# Patient Record
Sex: Male | Born: 1950 | Race: Black or African American | Hispanic: No | Marital: Single | Smoking: Former smoker
Health system: Southern US, Community
[De-identification: ages and names within clinical notes are randomized; demographics above are authoritative.]

## PROBLEM LIST (undated history)

## (undated) DIAGNOSIS — M1990 Unspecified osteoarthritis, unspecified site: Secondary | ICD-10-CM

## (undated) DIAGNOSIS — D649 Anemia, unspecified: Secondary | ICD-10-CM

## (undated) DIAGNOSIS — K219 Gastro-esophageal reflux disease without esophagitis: Secondary | ICD-10-CM

---

## 2004-06-21 ENCOUNTER — Encounter: Admission: RE | Admit: 2004-06-21 | Discharge: 2004-06-21 | Payer: Self-pay | Admitting: Family Medicine

## 2005-02-17 ENCOUNTER — Encounter: Admission: RE | Admit: 2005-02-17 | Discharge: 2005-02-17 | Payer: Self-pay | Admitting: Family Medicine

## 2009-04-26 ENCOUNTER — Emergency Department (HOSPITAL_COMMUNITY): Admission: EM | Admit: 2009-04-26 | Discharge: 2009-04-26 | Payer: Self-pay | Admitting: Emergency Medicine

## 2015-09-29 ENCOUNTER — Encounter (HOSPITAL_COMMUNITY): Payer: Self-pay | Admitting: Emergency Medicine

## 2015-09-29 ENCOUNTER — Ambulatory Visit (HOSPITAL_COMMUNITY)
Admission: EM | Admit: 2015-09-29 | Discharge: 2015-09-29 | Disposition: A | Discharge status: Home / Self Care | Payer: BC Managed Care – PPO | Attending: Emergency Medicine | Admitting: Emergency Medicine

## 2015-09-29 DIAGNOSIS — J069 Acute upper respiratory infection, unspecified: Secondary | ICD-10-CM | POA: Diagnosis not present

## 2015-09-29 DIAGNOSIS — M7542 Impingement syndrome of left shoulder: Secondary | ICD-10-CM

## 2015-09-29 DIAGNOSIS — M25812 Other specified joint disorders, left shoulder: Secondary | ICD-10-CM

## 2015-09-29 DIAGNOSIS — M791 Myalgia: Secondary | ICD-10-CM | POA: Diagnosis not present

## 2015-09-29 DIAGNOSIS — M7918 Myalgia, other site: Secondary | ICD-10-CM

## 2015-09-29 MED ORDER — IPRATROPIUM BROMIDE 0.06 % NA SOLN: 2.0000 | Freq: Four times a day (QID) | NASAL | Status: DC

## 2015-09-29 MED ORDER — CYCLOBENZAPRINE HCL 10 MG PO TABS: 10.0000 mg | ORAL_TABLET | Freq: Three times a day (TID) | ORAL | Status: DC | PRN

## 2015-09-29 MED ORDER — ALBUTEROL SULFATE HFA 108 (90 BASE) MCG/ACT IN AERS: 1.0000 | INHALATION_SPRAY | Freq: Four times a day (QID) | RESPIRATORY_TRACT | Status: DC | PRN

## 2015-09-29 MED ORDER — AEROCHAMBER PLUS MISC: Status: DC

## 2015-09-29 MED ORDER — AZITHROMYCIN 250 MG PO TABS: 250.0000 mg | ORAL_TABLET | Freq: Every day | ORAL | Status: DC

## 2015-09-29 MED ORDER — GUAIFENESIN-CODEINE 100-10 MG/5ML PO SYRP: 10.0000 mL | ORAL_SOLUTION | Freq: Four times a day (QID) | ORAL | Status: DC | PRN

## 2015-09-29 NOTE — Discharge Instructions (Signed)
Start some Allegra D, Claritin-D, Zyrtec-D. Start some saline nasal irrigation. Use the nasal spray, albuterol and spacer in the Cheratussin for a few days and if you get better do not fill the azithromycin. If not getting better, then go ahead and fill it.  2 tabs of Aleve twice a day. Flexeril as needed for muscle spasm. Follow-up with orthopedics if the shoulder is not getting better in one week.  Follow up with a primary care physician of your choice. Here a list of primary care practices.  If you need help with getting financial assistance, assistance in getting medications or finding a primary care physican, contact Maggy Mena here at the Urgent Care [facility]. Her phone number is [phone].   [facility] [phone] [address] [place] [address]  [facility]: [address] [place] [id number]  [phone]  [facility]: [address] [place] [id number]   [phone]  [facility]: [address] [place] [address]  [phone]  [facility] : [address] [place] [id number] [phone]  [facility]: [address] [place] [phone]  [facility]: [address] [place] [id number] [phone]  Dr. Oneal GroutMahima Pandey [address] [facility] [place] [id number]  [phone]  Dr. Jackie PlumGeorge Osei-Bonsu, [facility]. [address] [place] [address]  [phone]

## 2015-09-29 NOTE — ED Notes (Signed)
C/o cold sx onset x2 weeks associated w/prod cough, chills, runny nose and congestion.... A&O x4... No acute distress.

## 2015-09-29 NOTE — ED Provider Notes (Signed)
HPI  SUBJECTIVE:  Aaron Mejia is a 65 y.o. male who presents with 2 complaints. First he reports URI-like symptoms with nasal congestion, rhinorrhea, postnasal drip, watery eyes, malaise, coughing, wheezing, chills for 2 weeks. States cough is same color as is postnasal drip. He states that he has been taking Aleve, Advil Cold and Sinus, NyQuil with some improvement in symptoms. There are no aggravating factors. States he cannot sleep at night secondary to the cough. Denies ear pain, sinus pain or pressure, sneezing, itchy eyes, chest pain, shortness of breath. Reports decreased appetite, but is tolerating by mouth. Denies fevers. No double sickening, sore throat, hemoptysis. He has taken antipyretic in the past 8 hours. Has a past medical history of allergies, which bother him at this time of year, and asthma. No history of hypertension, diabetes, CHF, MI, kidney disease, smoking.  Second, he reports constant stabbing posterior left shoulder pain along the shoulder blade for the past 3 weeks. States that it radiates down his left arm and reports some paresthesias described as pins and needles going down to his middle finger. States that he is right-handed, but uses left arm a lot while sweeping. Symptoms are worse with rowing motion, using his arm, better with Aleve. 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 He denies rash, neck pain, back pain, chest pain, shortness of breath, arm weakness, color changes. He has a past medical history of left shoulder injury.    History reviewed. No pertinent past medical history.  History reviewed. No pertinent past surgical history.  No family history on file.  Social History  Substance Use Topics  . Smoking status: Never Smoker   . Smokeless tobacco: None  . Alcohol Use: Yes    No current facility-administered medications for this encounter.  Current outpatient prescriptions:  .  albuterol (PROVENTIL  HFA;VENTOLIN HFA) 108 (90 Base) MCG/ACT inhaler, Inhale 1-2 puffs into the lungs every 6 (six) hours as needed for wheezing or shortness of breath., Disp: 1 Inhaler, Rfl: 0 .  azithromycin (ZITHROMAX) 250 MG tablet, Take 1 tablet (250 mg total) by mouth daily. 2 tabs po on day 1, 1 tab po on days 2-5, Disp: 6 tablet, Rfl: 0 .  cyclobenzaprine (FLEXERIL) 10 MG tablet, Take 1 tablet (10 mg total) by mouth 3 (three) times daily as needed for muscle spasms., Disp: 20 tablet, Rfl: 0 .  guaiFENesin-codeine (CHERATUSSIN AC) 100-10 MG/5ML syrup, Take 10 mLs by mouth 4 (four) times daily as needed for cough., Disp: 120 mL, Rfl: 0 .  ipratropium (ATROVENT) 0.06 % nasal spray, Place 2 sprays into both nostrils 4 (four) times daily. 3-4 times/ day, Disp: 15 mL, Rfl: 0 .  Spacer/Aero-Holding Chambers (AEROCHAMBER PLUS) inhaler, Use as instructed, Disp: 1 each, Rfl: 2  No Known Allergies   ROS  As noted in HPI.   Physical Exam  BP 139/84 mmHg  Pulse 91  Temp(Src) 99.7 F (37.6 C) (Oral)  SpO2 100%  Constitutional: Well developed, well nourished, no acute distress Eyes: PERRL, EOMI, conjunctiva normal bilaterally HENT: Normocephalic, atraumatic,mucus membranes moist Respiratory: Clear to auscultation bilaterally, no rales, no wheezing, no rhonchi Cardiovascular: Normal rate and rhythm, no murmurs, no gallops, no rubs GI: Soft, nondistended, normal bowel sounds, nontender, no rebound, no guarding Back: no CVAT skin: No rash, skin intact Musculoskeletal: No C-spine, T-spine, rib tenderness. No rash. Positive tenderness over the rhomboid and inferior to the scapula. Pain aggravated with rowing motion. Positive empty  can test. No bony tenderness, deformity. No shoulder joint, AC joint, clavicular tenderness. No tenderness along the trapezius. Negative liftoff test,  No edema, no tenderness, no deformities. Sensation and strength intact in the median/radial/ulnar distribution. RP 2+ and equal bilaterally.  Grip 5/5 and equal bilaterally. Neurologic: Alert & oriented x 3, CN II-XII grossly intact, no motor deficits, sensation grossly intact Psychiatric: Speech and behavior appropriate   ED Course   Medications - No data to display  No orders of the defined types were placed in this encounter.   No results found for this or any previous visit (from the past 24 hour(s)). No results found.  ED Clinical Impression  URI (upper respiratory infection)  Rhomboid muscle pain  Shoulder impingement, left   ED Assessment/Plan  Presentation consistent with URI, although he could have allergy component to this. We will have him start nonsedating antihistamine/decongestant such as Claritin, Allegra, Zyrtec D, saline nasal irrigation, albuterol with spacer, Cheratussin, wait-and-see prescription of azithromycin to cover pneumonia if he does not get better with this treatment, as he has been symptomatic for 2 weeks.  Shoulder: Suggestive of overuse/muscle spasm perhaps with some rotator cuff impingement. No evidence of dislocation, fracture. Doubt cardiac cause given muscular tenderness and pain aggravated with movement. He has no neurologic findings, bony tenderness or neck, thoracic spine pain. Deferring imaging at this time. Advised 2 tabs of Aleve twice a day, also, Flexeril. States he does not need a prescription for Aleve.  Providing primary care referral for ongoing management, also providing orthopedic follow-up referral if his shoulder does not get any better.  Discussed MDM, plan and followup with patient. Discussed sn/sx that should prompt return to the ED. Patient  agrees with plan.   *This clinic note was created using Dragon dictation software. Therefore, there may be occasional mistakes despite careful proofreading.  ?  Domenick GongAshley Kaleiyah Polsky, MD 10/01/15 1046

## 2016-06-13 ENCOUNTER — Other Ambulatory Visit: Payer: Self-pay | Admitting: Family Medicine

## 2016-06-13 ENCOUNTER — Ambulatory Visit
Admission: RE | Admit: 2016-06-13 | Discharge: 2016-06-13 | Disposition: A | Discharge status: Home / Self Care | Payer: BC Managed Care – PPO | Source: Ambulatory Visit | Attending: Family Medicine | Admitting: Family Medicine

## 2016-06-13 DIAGNOSIS — R52 Pain, unspecified: Secondary | ICD-10-CM

## 2016-08-24 ENCOUNTER — Ambulatory Visit: Payer: Self-pay | Admitting: Physician Assistant

## 2016-08-24 NOTE — H&P (Signed)
TOTAL HIP ADMISSION H&P  Patient is admitted for left total hip arthroplasty.  Subjective:  Chief Complaint: left hip pain  HPI: Aaron Mejia, 66 y.o. male, has a history of pain and functional disability in the left hip(s) due to arthritis and patient has failed non-surgical conservative treatments for greater than 12 weeks to include NSAID's and/or analgesics and activity modification.  Onset of symptoms was gradual starting 5 years ago with gradually worsening course since that time.The patient noted no past surgery on the left hip(s).  Patient currently rates pain in the left hip at 8 out of 10 with activity. Patient has night pain, worsening of pain with activity and weight bearing and pain that interfers with activities of daily living. Patient has evidence of periarticular osteophytes and joint space narrowing by imaging studies. This condition presents safety issues increasing the risk of falls.There is no current active infection.  There are no active problems to display for this patient.  No past medical history on file.  No past surgical history on file.   (Not in a hospital admission) Allergies  Allergen Reactions  . Penicillins Swelling    Has never taken it was just told it makes him swell  Has patient had a PCN reaction causing immediate rash, facial/tongue/throat swelling, SOB or lightheadedness with hypotension: unknown Has patient had a PCN reaction causing severe rash involving mucus membranes or skin necrosis: unknown Has patient had a PCN reaction that required hospitalization unknown Has patient had a PCN reaction occurring within the last 10 years: No If all of the above answers are "NO", then may proceed with Cephalosporin    Social History  Substance Use Topics  . Smoking status: Never Smoker  . Smokeless tobacco: Not on file  . Alcohol use Yes    No family history on file.   Review of Systems  Musculoskeletal: Positive for joint pain.  All other  systems reviewed and are negative.   Objective:  Physical Exam  Constitutional: He is oriented to person, place, and time. He appears well-developed and well-nourished. No distress.  HENT:  Head: Normocephalic and atraumatic.  Nose: Nose normal.  Eyes: Conjunctivae and EOM are normal. Pupils are equal, round, and reactive to light.  Neck: Normal range of motion. Neck supple.  Cardiovascular: Normal rate, regular rhythm, normal heart sounds and intact distal pulses.   Respiratory: Effort normal and breath sounds normal. No respiratory distress. He has no wheezes.  GI: Soft. Bowel sounds are normal. He exhibits no distension. There is no tenderness.  Musculoskeletal:       Left hip: He exhibits decreased range of motion, tenderness and bony tenderness.  Neurological: He is alert and oriented to person, place, and time. No cranial nerve deficit.  Skin: Skin is warm and dry. No rash noted. No erythema.  Psychiatric: He has a normal mood and affect. His behavior is normal.    Vital signs in last 24 hours: @VSRANGES @  Labs:   There is no height or weight on file to calculate BMI.   Imaging Review Plain radiographs demonstrate moderate degenerative joint disease of the left hip(s). The bone quality appears to be good for age and reported activity level.  Assessment/Plan:  End stage arthritis, left hip(s)  The patient history, physical examination, clinical judgement of the provider and imaging studies are consistent with end stage degenerative joint disease of the left hip(s) and total hip arthroplasty is deemed medically necessary. The treatment options including medical management, injection therapy, arthroscopy  and arthroplasty were discussed at length. The risks and benefits of total hip arthroplasty were presented and reviewed. The risks due to aseptic loosening, infection, stiffness, dislocation/subluxation,  thromboembolic complications and other imponderables were discussed.   The patient acknowledged the explanation, agreed to proceed with the plan and consent was signed. Patient is being admitted for inpatient treatment for surgery, pain control, PT, OT, prophylactic antibiotics, VTE prophylaxis, progressive ambulation and ADL's and discharge planning.The patient is planning to be discharged home with home health services

## 2016-08-25 ENCOUNTER — Encounter (HOSPITAL_COMMUNITY): Payer: Self-pay

## 2016-08-25 ENCOUNTER — Ambulatory Visit (HOSPITAL_COMMUNITY)
Admission: RE | Admit: 2016-08-25 | Discharge: 2016-08-25 | Disposition: A | Discharge status: Home / Self Care | Payer: BC Managed Care – PPO | Source: Ambulatory Visit | Attending: Physician Assistant | Admitting: Physician Assistant

## 2016-08-25 ENCOUNTER — Encounter (HOSPITAL_COMMUNITY)
Admission: RE | Admit: 2016-08-25 | Discharge: 2016-08-25 | Disposition: A | Discharge status: Home / Self Care | Payer: BC Managed Care – PPO | Source: Ambulatory Visit | Attending: Orthopedic Surgery | Admitting: Orthopedic Surgery

## 2016-08-25 DIAGNOSIS — M1612 Unilateral primary osteoarthritis, left hip: Secondary | ICD-10-CM | POA: Insufficient documentation

## 2016-08-25 HISTORY — DX: Gastro-esophageal reflux disease without esophagitis: K21.9

## 2016-08-25 HISTORY — DX: Unspecified osteoarthritis, unspecified site: M19.90

## 2016-08-25 HISTORY — DX: Anemia, unspecified: D64.9

## 2016-08-25 LAB — COMPREHENSIVE METABOLIC PANEL
ALK PHOS: 53 U/L (ref 38–126)
ALT: 16 U/L — ABNORMAL LOW (ref 17–63)
AST: 24 U/L (ref 15–41)
Albumin: 4.1 g/dL (ref 3.5–5.0)
Anion gap: 7 (ref 5–15)
BILIRUBIN TOTAL: 0.5 mg/dL (ref 0.3–1.2)
BUN: 18 mg/dL (ref 6–20)
CALCIUM: 8.9 mg/dL (ref 8.9–10.3)
CO2: 25 mmol/L (ref 22–32)
Chloride: 105 mmol/L (ref 101–111)
Creatinine, Ser: 1.26 mg/dL — ABNORMAL HIGH (ref 0.61–1.24)
GFR calc Af Amer: >60 mL/min (ref >60)
GFR, EST NON AFRICAN AMERICAN: 59 mL/min — AB (ref >60)
Glucose, Bld: 118 mg/dL — ABNORMAL HIGH (ref 65–99)
POTASSIUM: 4.3 mmol/L (ref 3.5–5.1)
Sodium: 137 mmol/L (ref 135–145)
TOTAL PROTEIN: 6.7 g/dL (ref 6.5–8.1)

## 2016-08-25 LAB — URINALYSIS, ROUTINE W REFLEX MICROSCOPIC
BILIRUBIN URINE: NEGATIVE
Glucose, UA: NEGATIVE mg/dL
Hgb urine dipstick: NEGATIVE
Ketones, ur: NEGATIVE mg/dL
LEUKOCYTES UA: NEGATIVE
NITRITE: NEGATIVE
PH: 5 (ref 5.0–8.0)
Protein, ur: NEGATIVE mg/dL
SPECIFIC GRAVITY, URINE: 1.015 (ref 1.005–1.030)

## 2016-08-25 LAB — CBC WITH DIFFERENTIAL/PLATELET
BASOS ABS: 0 10*3/uL (ref 0.0–0.1)
Basophils Relative: 0 %
Eosinophils Absolute: 0 10*3/uL (ref 0.0–0.7)
Eosinophils Relative: 1 %
HCT: 38.3 % — ABNORMAL LOW (ref 39.0–52.0)
Hemoglobin: 12.6 g/dL — ABNORMAL LOW (ref 13.0–17.0)
Lymphocytes Relative: 38 %
Lymphs Abs: 1.5 10*3/uL (ref 0.7–4.0)
MCH: 27.8 pg (ref 26.0–34.0)
MCHC: 32.9 g/dL (ref 30.0–36.0)
MCV: 84.4 fL (ref 78.0–100.0)
MONO ABS: 0.3 10*3/uL (ref 0.1–1.0)
MONOS PCT: 7 %
Neutro Abs: 2 10*3/uL (ref 1.7–7.7)
Neutrophils Relative %: 54 %
Platelets: 247 10*3/uL (ref 150–400)
RBC: 4.54 MIL/uL (ref 4.22–5.81)
RDW: 14.5 % (ref 11.5–15.5)
WBC: 3.8 10*3/uL — ABNORMAL LOW (ref 4.0–10.5)

## 2016-08-25 LAB — TYPE AND SCREEN
ABO/RH(D): O NEG
ANTIBODY SCREEN: NEGATIVE

## 2016-08-25 LAB — APTT: aPTT: 37 seconds — ABNORMAL HIGH (ref 24–36)

## 2016-08-25 LAB — ABO/RH: ABO/RH(D): O NEG

## 2016-08-25 LAB — PROTIME-INR
INR: 0.96
PROTHROMBIN TIME: 12.8 s (ref 11.4–15.2)

## 2016-08-25 LAB — SURGICAL PCR SCREEN
MRSA, PCR: NEGATIVE
Staphylococcus aureus: NEGATIVE

## 2016-08-25 NOTE — Pre-Procedure Instructions (Addendum)
Levora AngelWilbert B Slotnick  08/25/2016      [facility] [id number] [place] Otto- [place] - [address] [facility] [address] [place] [phone] Phone: [phone] Fax: [phone]    Your procedure is scheduled on Fri. May 25  [facility].M.  Call this number if you have problems the morning of surgery:  [phone]   Remember:  Do not eat food or drink liquids after midnight on Thurs. May 24   Take these medicines the morning of surgery with A SIP OF WATER : albuterol inhaler if needed-bring to hospital, atrovent nasal spray if needed, eye drops  1 week prior to surgery stop: etodolac (lodine), aspirin, advil, motrin, ibuprofen, aleve, BC Powders, Goody's, vitamins/herbal medicines.   Do not wear jewelry.  Do not wear lotions, powders, or perfumes, or deoderant.  Do not shave 48 hours prior to surgery.  Men may shave face and neck.  Do not bring valuables to the hospital.  [facility] is not responsible for any belongings or valuables.  Contacts, dentures or bridgework may not be worn into surgery.  Leave your suitcase in the car.  After surgery it may be brought to your room.  For patients admitted to the hospital, discharge time will be determined by your treatment team.  Patients discharged the day of surgery will not be allowed to drive home.    Special instructions:  [facility]- Preparing For Surgery  Before surgery, you can play an important role. Because skin is not sterile, your skin needs to be as free of germs as possible. You can reduce the number of germs on your skin by washing with CHG (chlorahexidine gluconate) Soap before surgery.  CHG is an antiseptic cleaner which kills germs and bonds with the skin to continue killing germs even after washing.  Please do not use if you have an allergy to CHG or antibacterial soaps. If your skin becomes reddened/irritated stop using the  CHG.  Do not shave (including legs and underarms) for at least 48 hours prior to first CHG shower. It is OK to shave your face.  Please follow these instructions carefully.   1. Shower the NIGHT BEFORE SURGERY and the MORNING OF SURGERY with CHG.   2. If you chose to wash your hair, wash your hair first as usual with your normal shampoo.  3. After you shampoo, rinse your hair and body thoroughly to remove the shampoo.  4. Use CHG as you would any other liquid soap. You can apply CHG directly to the skin and wash gently with a scrungie or a clean washcloth.   5. Apply the CHG Soap to your body ONLY FROM THE NECK DOWN.  Do not use on open wounds or open sores. Avoid contact with your eyes, ears, mouth and genitals (private parts). Wash genitals (private parts) with your normal soap.  6. Wash thoroughly, paying special attention to the area where your surgery will be performed.  7. Thoroughly rinse your body with warm water from the neck down.  8. DO NOT shower/wash with your normal soap after using and rinsing off the CHG Soap.  9. Pat yourself dry with a CLEAN TOWEL.   10. Wear CLEAN PAJAMAS   11. Place CLEAN SHEETS on your bed the night of your first shower and DO NOT SLEEP WITH PETS.    Day of Surgery: Do not apply  any deodorants/lotions. Please wear clean clothes to the hospital/surgery center.      Please read over the following fact sheets that you were given. Coughing and Deep Breathing, Total Joint Packet, MRSA Information and Surgical Site Infection Prevention

## 2016-08-26 LAB — URINE CULTURE: Culture: NO GROWTH

## 2016-08-29 LAB — HM COLONOSCOPY

## 2016-09-04 MED ORDER — TRANEXAMIC ACID 1000 MG/10ML IV SOLN
1000.0000 mg | INTRAVENOUS | Status: AC
  Administered 2016-09-05: 1000 mg via INTRAVENOUS
  Filled 2016-09-04: qty 10

## 2016-09-05 ENCOUNTER — Inpatient Hospital Stay (HOSPITAL_COMMUNITY): Payer: BC Managed Care – PPO | Admitting: Certified Registered Nurse Anesthetist

## 2016-09-05 ENCOUNTER — Encounter (HOSPITAL_COMMUNITY)
Admission: RE | Disposition: A | Discharge status: Home Health | Payer: Self-pay | Source: Ambulatory Visit | Attending: Orthopedic Surgery

## 2016-09-05 ENCOUNTER — Inpatient Hospital Stay (HOSPITAL_COMMUNITY)
Admission: RE | Admit: 2016-09-05 | Discharge: 2016-09-06 | DRG: 470 | Disposition: A | Discharge status: Home Health | Payer: BC Managed Care – PPO | Source: Ambulatory Visit | Attending: Orthopedic Surgery | Admitting: Orthopedic Surgery

## 2016-09-05 ENCOUNTER — Encounter (HOSPITAL_COMMUNITY): Payer: Self-pay | Admitting: *Deleted

## 2016-09-05 ENCOUNTER — Inpatient Hospital Stay (HOSPITAL_COMMUNITY): Payer: BC Managed Care – PPO

## 2016-09-05 DIAGNOSIS — M1612 Unilateral primary osteoarthritis, left hip: Secondary | ICD-10-CM | POA: Diagnosis present

## 2016-09-05 DIAGNOSIS — Z79899 Other long term (current) drug therapy: Secondary | ICD-10-CM | POA: Diagnosis not present

## 2016-09-05 DIAGNOSIS — M1990 Unspecified osteoarthritis, unspecified site: Secondary | ICD-10-CM | POA: Diagnosis present

## 2016-09-05 DIAGNOSIS — Z880 Allergy status to penicillin: Secondary | ICD-10-CM | POA: Diagnosis not present

## 2016-09-05 DIAGNOSIS — F17200 Nicotine dependence, unspecified, uncomplicated: Secondary | ICD-10-CM | POA: Diagnosis present

## 2016-09-05 HISTORY — PX: TOTAL HIP ARTHROPLASTY: SHX124

## 2016-09-05 SURGERY — ARTHROPLASTY, HIP, TOTAL,POSTERIOR APPROACH
Anesthesia: Spinal | Site: Hip | Laterality: Left

## 2016-09-05 MED ORDER — SUCCINYLCHOLINE CHLORIDE 200 MG/10ML IV SOSY
PREFILLED_SYRINGE | INTRAVENOUS | Status: AC
  Filled 2016-09-05: qty 10

## 2016-09-05 MED ORDER — MENTHOL 3 MG MT LOZG: 1.0000 | LOZENGE | OROMUCOSAL | Status: DC | PRN

## 2016-09-05 MED ORDER — METOCLOPRAMIDE HCL 5 MG/ML IJ SOLN: 10.0000 mg | Freq: Once | INTRAMUSCULAR | Status: DC | PRN

## 2016-09-05 MED ORDER — PHENOL 1.4 % MT LIQD: 1.0000 | OROMUCOSAL | Status: DC | PRN

## 2016-09-05 MED ORDER — TRANEXAMIC ACID 1000 MG/10ML IV SOLN
2000.0000 mg | INTRAVENOUS | Status: AC
  Administered 2016-09-05: 2000 mg via TOPICAL
  Filled 2016-09-05: qty 20

## 2016-09-05 MED ORDER — ONDANSETRON HCL 4 MG PO TABS: 4.0000 mg | ORAL_TABLET | Freq: Four times a day (QID) | ORAL | Status: DC | PRN

## 2016-09-05 MED ORDER — OXYCODONE-ACETAMINOPHEN 5-325 MG PO TABS: 1.0000 | ORAL_TABLET | ORAL | 0 refills | Status: DC | PRN

## 2016-09-05 MED ORDER — VANCOMYCIN HCL IN DEXTROSE 1-5 GM/200ML-% IV SOLN
1000.0000 mg | Freq: Two times a day (BID) | INTRAVENOUS | Status: AC
  Administered 2016-09-05: 1000 mg via INTRAVENOUS
  Filled 2016-09-05: qty 200

## 2016-09-05 MED ORDER — CHLORHEXIDINE GLUCONATE 4 % EX LIQD: 60.0000 mL | Freq: Once | CUTANEOUS | Status: DC

## 2016-09-05 MED ORDER — ACETAMINOPHEN 650 MG RE SUPP: 650.0000 mg | Freq: Four times a day (QID) | RECTAL | Status: DC | PRN

## 2016-09-05 MED ORDER — DOCUSATE SODIUM 100 MG PO CAPS
100.0000 mg | ORAL_CAPSULE | Freq: Two times a day (BID) | ORAL | Status: DC
  Administered 2016-09-05 – 2016-09-06 (×2): 100 mg via ORAL
  Filled 2016-09-05 (×2): qty 1

## 2016-09-05 MED ORDER — DEXTROSE 5 % IV SOLN
INTRAVENOUS | Status: DC | PRN
  Administered 2016-09-05: 15 ug/min via INTRAVENOUS

## 2016-09-05 MED ORDER — APIXABAN 2.5 MG PO TABS: 2.5000 mg | ORAL_TABLET | Freq: Two times a day (BID) | ORAL | 0 refills | Status: DC

## 2016-09-05 MED ORDER — FENTANYL CITRATE (PF) 100 MCG/2ML IJ SOLN
25.0000 ug | INTRAMUSCULAR | Status: DC | PRN
Start: 2016-09-05 — End: 2016-09-05

## 2016-09-05 MED ORDER — LIDOCAINE 2% (20 MG/ML) 5 ML SYRINGE
INTRAMUSCULAR | Status: AC
  Filled 2016-09-05: qty 5

## 2016-09-05 MED ORDER — LACTATED RINGERS IV SOLN: INTRAVENOUS | Status: DC

## 2016-09-05 MED ORDER — BUPIVACAINE HCL (PF) 0.5 % IJ SOLN
INTRAMUSCULAR | Status: AC
  Filled 2016-09-05: qty 30

## 2016-09-05 MED ORDER — PROPOFOL 500 MG/50ML IV EMUL
INTRAVENOUS | Status: DC | PRN
  Administered 2016-09-05: 25 ug/kg/min via INTRAVENOUS

## 2016-09-05 MED ORDER — EPHEDRINE 5 MG/ML INJ
INTRAVENOUS | Status: AC
  Filled 2016-09-05: qty 10

## 2016-09-05 MED ORDER — OXYCODONE HCL 5 MG PO TABS
5.0000 mg | ORAL_TABLET | ORAL | Status: DC | PRN
  Administered 2016-09-05 – 2016-09-06 (×5): 10 mg via ORAL
  Filled 2016-09-05 (×5): qty 2

## 2016-09-05 MED ORDER — DIPHENHYDRAMINE HCL 12.5 MG/5ML PO ELIX: 12.5000 mg | ORAL_SOLUTION | ORAL | Status: DC | PRN

## 2016-09-05 MED ORDER — METOCLOPRAMIDE HCL 5 MG/ML IJ SOLN: 5.0000 mg | Freq: Three times a day (TID) | INTRAMUSCULAR | Status: DC | PRN

## 2016-09-05 MED ORDER — VANCOMYCIN HCL IN DEXTROSE 1-5 GM/200ML-% IV SOLN
1000.0000 mg | INTRAVENOUS | Status: AC
  Administered 2016-09-05: 1000 mg via INTRAVENOUS

## 2016-09-05 MED ORDER — METHOCARBAMOL 500 MG PO TABS
500.0000 mg | ORAL_TABLET | Freq: Four times a day (QID) | ORAL | Status: DC | PRN
  Administered 2016-09-05 (×2): 500 mg via ORAL
  Filled 2016-09-05 (×2): qty 1

## 2016-09-05 MED ORDER — BUPIVACAINE HCL (PF) 0.5 % IJ SOLN
INTRAMUSCULAR | Status: DC | PRN
  Administered 2016-09-05: 3 mL via INTRATHECAL

## 2016-09-05 MED ORDER — MEPERIDINE HCL 25 MG/ML IJ SOLN: 6.2500 mg | INTRAMUSCULAR | Status: DC | PRN

## 2016-09-05 MED ORDER — PROPOFOL 10 MG/ML IV BOLUS
INTRAVENOUS | Status: AC
  Filled 2016-09-05: qty 20

## 2016-09-05 MED ORDER — MIDAZOLAM HCL 2 MG/2ML IJ SOLN
INTRAMUSCULAR | Status: AC
  Filled 2016-09-05: qty 2

## 2016-09-05 MED ORDER — FENTANYL CITRATE (PF) 250 MCG/5ML IJ SOLN
INTRAMUSCULAR | Status: AC
  Filled 2016-09-05: qty 5

## 2016-09-05 MED ORDER — FLEET ENEMA 7-19 GM/118ML RE ENEM
1.0000 | ENEMA | Freq: Once | RECTAL | Status: DC | PRN
Start: 2016-09-05 — End: 2016-09-06

## 2016-09-05 MED ORDER — METHOCARBAMOL 1000 MG/10ML IJ SOLN: 500.0000 mg | Freq: Four times a day (QID) | INTRAMUSCULAR | Status: DC | PRN

## 2016-09-05 MED ORDER — TRANEXAMIC ACID 1000 MG/10ML IV SOLN
1000.0000 mg | Freq: Once | INTRAVENOUS | Status: AC
  Administered 2016-09-05: 1000 mg via INTRAVENOUS
  Filled 2016-09-05: qty 10

## 2016-09-05 MED ORDER — EPINEPHRINE PF 1 MG/ML IJ SOLN
INTRAMUSCULAR | Status: AC
  Filled 2016-09-05: qty 1

## 2016-09-05 MED ORDER — APIXABAN 2.5 MG PO TABS
2.5000 mg | ORAL_TABLET | Freq: Two times a day (BID) | ORAL | Status: DC
  Administered 2016-09-06: 2.5 mg via ORAL
  Filled 2016-09-05: qty 1

## 2016-09-05 MED ORDER — HYDROMORPHONE HCL 1 MG/ML IJ SOLN
1.0000 mg | INTRAMUSCULAR | Status: DC | PRN
  Administered 2016-09-05 – 2016-09-06 (×2): 1 mg via INTRAVENOUS
  Filled 2016-09-05 (×2): qty 1

## 2016-09-05 MED ORDER — LACTATED RINGERS IV SOLN
INTRAVENOUS | Status: DC | PRN
  Administered 2016-09-05: 07:00:00 via INTRAVENOUS

## 2016-09-05 MED ORDER — SORBITOL 70 % SOLN: 30.0000 mL | Freq: Every day | Status: DC | PRN

## 2016-09-05 MED ORDER — ACETAMINOPHEN 325 MG PO TABS: 650.0000 mg | ORAL_TABLET | Freq: Four times a day (QID) | ORAL | Status: DC | PRN

## 2016-09-05 MED ORDER — BUPIVACAINE-EPINEPHRINE 0.5% -1:200000 IJ SOLN
INTRAMUSCULAR | Status: DC | PRN
  Administered 2016-09-05: 30 mL

## 2016-09-05 MED ORDER — ALBUTEROL SULFATE (2.5 MG/3ML) 0.083% IN NEBU: 2.5000 mg | INHALATION_SOLUTION | Freq: Four times a day (QID) | RESPIRATORY_TRACT | Status: DC | PRN

## 2016-09-05 MED ORDER — SODIUM CHLORIDE 0.9 % IV SOLN
INTRAVENOUS | Status: DC
  Administered 2016-09-06: 75 mL/h via INTRAVENOUS

## 2016-09-05 MED ORDER — METOCLOPRAMIDE HCL 5 MG PO TABS: 5.0000 mg | ORAL_TABLET | Freq: Three times a day (TID) | ORAL | Status: DC | PRN

## 2016-09-05 MED ORDER — VANCOMYCIN HCL IN DEXTROSE 1-5 GM/200ML-% IV SOLN
INTRAVENOUS | Status: AC
  Filled 2016-09-05: qty 200

## 2016-09-05 MED ORDER — PHENYLEPHRINE 40 MCG/ML (10ML) SYRINGE FOR IV PUSH (FOR BLOOD PRESSURE SUPPORT)
PREFILLED_SYRINGE | INTRAVENOUS | Status: AC
  Filled 2016-09-05: qty 10

## 2016-09-05 MED ORDER — SENNOSIDES-DOCUSATE SODIUM 8.6-50 MG PO TABS: 1.0000 | ORAL_TABLET | Freq: Every evening | ORAL | Status: DC | PRN

## 2016-09-05 MED ORDER — SODIUM CHLORIDE 0.9 % IV SOLN: INTRAVENOUS | Status: DC

## 2016-09-05 MED ORDER — FENTANYL CITRATE (PF) 100 MCG/2ML IJ SOLN
INTRAMUSCULAR | Status: DC | PRN
  Administered 2016-09-05: 50 ug via INTRAVENOUS

## 2016-09-05 MED ORDER — MIDAZOLAM HCL 5 MG/5ML IJ SOLN
INTRAMUSCULAR | Status: DC | PRN
  Administered 2016-09-05: 2 mg via INTRAVENOUS

## 2016-09-05 MED ORDER — SODIUM CHLORIDE 0.9 % IR SOLN
Status: DC | PRN
  Administered 2016-09-05: 1000 mL

## 2016-09-05 MED ORDER — ROCURONIUM BROMIDE 10 MG/ML (PF) SYRINGE
PREFILLED_SYRINGE | INTRAVENOUS | Status: AC
  Filled 2016-09-05: qty 5

## 2016-09-05 MED ORDER — BUPIVACAINE LIPOSOME 1.3 % IJ SUSP
20.0000 mL | INTRAMUSCULAR | Status: AC
  Administered 2016-09-05: 20 mL
  Filled 2016-09-05: qty 20

## 2016-09-05 MED ORDER — ONDANSETRON HCL 4 MG/2ML IJ SOLN
4.0000 mg | Freq: Four times a day (QID) | INTRAMUSCULAR | Status: DC | PRN
  Administered 2016-09-05: 4 mg via INTRAVENOUS
  Filled 2016-09-05: qty 2

## 2016-09-05 SURGICAL SUPPLY — 56 items
BLADE SAW SAG 73X25 THK (BLADE) ×2
BLADE SAW SGTL 73X25 THK (BLADE) ×1 IMPLANT
BRUSH FEMORAL CANAL (MISCELLANEOUS) IMPLANT
CAPT HIP TOTAL 2 ×3 IMPLANT
COVER SURGICAL LIGHT HANDLE (MISCELLANEOUS) ×3 IMPLANT
DRAPE INCISE IOBAN 66X45 STRL (DRAPES) ×3 IMPLANT
DRAPE ORTHO SPLIT 77X108 STRL (DRAPES) ×4
DRAPE SURG ORHT 6 SPLT 77X108 (DRAPES) ×2 IMPLANT
DRAPE U-SHAPE 47X51 STRL (DRAPES) ×3 IMPLANT
DRSG ADAPTIC 3X8 NADH LF (GAUZE/BANDAGES/DRESSINGS) ×3 IMPLANT
DRSG PAD ABDOMINAL 8X10 ST (GAUZE/BANDAGES/DRESSINGS) ×3 IMPLANT
DURAPREP 26ML APPLICATOR (WOUND CARE) ×3 IMPLANT
ELECT BLADE 6.5 EXT (BLADE) IMPLANT
ELECT CAUTERY BLADE 6.4 (BLADE) ×3 IMPLANT
ELECT REM PT RETURN 9FT ADLT (ELECTROSURGICAL) ×3
ELECTRODE REM PT RTRN 9FT ADLT (ELECTROSURGICAL) ×1 IMPLANT
FACESHIELD WRAPAROUND (MASK) ×6 IMPLANT
GAUZE SPONGE 4X4 12PLY STRL (GAUZE/BANDAGES/DRESSINGS) ×3 IMPLANT
GLOVE BIOGEL PI IND STRL 8 (GLOVE) ×2 IMPLANT
GLOVE BIOGEL PI INDICATOR 8 (GLOVE) ×4
GLOVE ORTHO TXT STRL SZ7.5 (GLOVE) ×6 IMPLANT
GLOVE SURG ORTHO 8.0 STRL STRW (GLOVE) ×6 IMPLANT
GOWN STRL REUS W/ TWL LRG LVL3 (GOWN DISPOSABLE) ×1 IMPLANT
GOWN STRL REUS W/ TWL XL LVL3 (GOWN DISPOSABLE) ×1 IMPLANT
GOWN STRL REUS W/TWL 2XL LVL3 (GOWN DISPOSABLE) ×3 IMPLANT
GOWN STRL REUS W/TWL LRG LVL3 (GOWN DISPOSABLE) ×2
GOWN STRL REUS W/TWL XL LVL3 (GOWN DISPOSABLE) ×2
HANDPIECE INTERPULSE COAX TIP (DISPOSABLE) ×2
HOOD PEEL AWAY FACE SHEILD DIS (HOOD) ×3 IMPLANT
IMMOBILIZER KNEE 22 UNIV (SOFTGOODS) ×3 IMPLANT
KIT BASIN OR (CUSTOM PROCEDURE TRAY) ×3 IMPLANT
KIT ROOM TURNOVER OR (KITS) ×3 IMPLANT
MANIFOLD NEPTUNE II (INSTRUMENTS) ×3 IMPLANT
NEEDLE 22X1 1/2 (OR ONLY) (NEEDLE) ×3 IMPLANT
NEEDLE MAYO TROCAR (NEEDLE) IMPLANT
NS IRRIG 1000ML POUR BTL (IV SOLUTION) ×3 IMPLANT
PACK TOTAL JOINT (CUSTOM PROCEDURE TRAY) ×3 IMPLANT
PACK UNIVERSAL I (CUSTOM PROCEDURE TRAY) ×3 IMPLANT
PAD ARMBOARD 7.5X6 YLW CONV (MISCELLANEOUS) ×6 IMPLANT
PRESSURIZER FEMORAL UNIV (MISCELLANEOUS) IMPLANT
SET HNDPC FAN SPRY TIP SCT (DISPOSABLE) ×1 IMPLANT
STAPLER VISISTAT 35W (STAPLE) ×3 IMPLANT
SUCTION FRAZIER HANDLE 10FR (MISCELLANEOUS) ×2
SUCTION TUBE FRAZIER 10FR DISP (MISCELLANEOUS) ×1 IMPLANT
SUT ETHIBOND 2 V 37 (SUTURE) ×3 IMPLANT
SUT VIC AB 0 CT1 27 (SUTURE) ×2
SUT VIC AB 0 CT1 27XBRD ANBCTR (SUTURE) ×1 IMPLANT
SUT VIC AB 2-0 CT1 27 (SUTURE) ×4
SUT VIC AB 2-0 CT1 TAPERPNT 27 (SUTURE) ×2 IMPLANT
SYR CONTROL 10ML LL (SYRINGE) ×3 IMPLANT
TAPE CLOTH SURG 6X10 WHT LF (GAUZE/BANDAGES/DRESSINGS) ×3 IMPLANT
TOWEL OR 17X24 6PK STRL BLUE (TOWEL DISPOSABLE) ×3 IMPLANT
TOWEL OR 17X26 10 PK STRL BLUE (TOWEL DISPOSABLE) ×3 IMPLANT
TOWER CARTRIDGE SMART MIX (DISPOSABLE) IMPLANT
TRAY CATH 16FR W/PLASTIC CATH (SET/KITS/TRAYS/PACK) IMPLANT
WATER STERILE IRR 1000ML POUR (IV SOLUTION) ×3 IMPLANT

## 2016-09-05 NOTE — Op Note (Signed)
NAME:  Aaron Mejia, Aaron Mejia                    ACCOUNT NO.:  MEDICAL RECORD NO.:  [id number]  LOCATION:                                 FACILITY:  PHYSICIAN:  Dyke BrackettW. D. Josy Peaden, M.D.         DATE OF BIRTH:  DATE OF PROCEDURE:  09/05/2016 DATE OF DISCHARGE:                              OPERATIVE REPORT   PREOPERATIVE DIAGNOSIS:  Osteoarthritis, left hip.  POSTOPERATIVE DIAGNOSIS:  Osteoarthritis, left hip.  OPERATION:  Left total hip replacement (DePuy AML 15 mm small-statured stem with +5 mm neck length, 36 mm ceramic hip ball, 52 mm pinnacle Gription cup 52 mm diameter with ALTRX polyethylene acetabular liner +4, 10 degree).  SURGEON:  Dyke BrackettW. D. Rubina Basinski, MD.  ASSISTANT:  Margart SicklesJoshua Chadwell, PA-C.  ANESTHESIA:  Spinal anesthetic.  BLOOD LOSS:  Approximately 150.  DESCRIPTION OF PROCEDURE:  In lateral position, after spinal with a posterior approach to the hip made, we split the iliotibial band, gluteus maximus fascia, and the external rotators off the posterior aspect of the hip.  T capsulotomy was placed in the capsular structures to deliver the arthritic head, cut it about 1 fingerbreadth below the lesser troch.  Then, progressively reamed for a 0.5 mm under reaming to accept a 15 mm small statured stem, followed by rasping.  Attention was then directed to acetabulum.  Acetabular retractors were placed anteriorly and inferiorly with wing retractors superiorly and posteriorly.  The patient noted to have a moderate superior and posterior erosion of the acetabulum.  The acetabulum was centralized, followed by progressive reaming for a 1 mm under reaming of 51 mm to accept a 52 cup.  Final cup was placed with good purchase with a trial liner.  We then trialed off the broach and settled on the +5 mm neck length.  With the hip fully flexed, adducted, internally rotated, it would only come out at about 90 degrees internal rotation.  Final components were inserted after copious irrigation  with the acetabular final liner and the ceramic head in the prosthesis.  Again, all parameters were deemed to be acceptable relative to stability. Closure was affected with a T capsulotomy, being closed with #1 Ethibond and the gluteus maximus and iliotibial band with subcu tissues were closed with 2-0 Vicryl and the skin with stapling device.  Taken to recovery room in stable condition.     Dyke BrackettW. D. Nalina Yeatman, M.D.     WDC/MEDQ  D:  09/05/2016  T:  09/05/2016  Job:  [id number]

## 2016-09-05 NOTE — Evaluation (Addendum)
Physical Therapy Evaluation Patient Details Name: Aaron Mejia Gelber MRN: [id number] DOB: 06/20/1950 Today's Date: 09/05/2016   History of Present Illness  Pt is a 66 y/o male s/p elective L THA secondary to L hip OA. PMH includes current smoker.   Clinical Impression  Pt is s/p surgery above with deficits below. PTA, pt was independent with functional mobility. Upon evaluation, participation limited secondary to nausea/vomiting. Gait deferred this session, however, anticipate pt will progress well once nausea/vomiting controlled. Pt requiring min A for bed mobility and basic transfer. Will continue to progress mobility according to pt tolerance. Follow up recommendations per MD arrangements.     Follow Up Recommendations DC plan and follow up therapy as arranged by surgeon;Supervision/Assistance - 24 hour    Equipment Recommendations  Rolling walker with 5" wheels;3in1 (PT)    Recommendations for Other Services       Precautions / Restrictions Precautions Precautions: Posterior Hip Precaution Booklet Issued: Yes (comment) Precaution Comments: Reviewed posterior total hip precautions and also supine ther ex with pt.  Required Braces or Orthoses: Knee Immobilizer - Left Knee Immobilizer - Left: Other (comment) (until discontinued ) Restrictions Weight Bearing Restrictions: Yes LLE Weight Bearing: Weight bearing as tolerated      Mobility  Bed Mobility Overal bed mobility: Needs Assistance Bed Mobility: Supine to Sit;Sit to Supine     Supine to sit: Min assist Sit to supine: Min assist   General bed mobility comments: Min A for LLE management. Verbal cues for maintenance of hip precautions during bed mobility.   Transfers Overall transfer level: Needs assistance Equipment used: Rolling walker (2 wheeled) Transfers: Sit to/from Stand Sit to Stand: Min assist         General transfer comment: Min A for steadying upon standing. Upon standing, pt experiencing nausea and  lightheadedness. Seated rest break, and pt experiencing nausea/vomiting. Further mobility deferred. RN notifed and administered nausea meds.   Ambulation/Gait             General Gait Details: Deferred secondary to nausea/vomiting.   Stairs            Wheelchair Mobility    Modified Rankin (Stroke Patients Only)       Balance Overall balance assessment: Needs assistance Sitting-balance support: Feet supported;No upper extremity supported Sitting balance-Leahy Scale: Normal     Standing balance support: Bilateral upper extremity supported;During functional activity Standing balance-Leahy Scale: Poor Standing balance comment: Reliant on RW for standing balance                              Pertinent Vitals/Pain Pain Assessment: 0-10 Pain Score: 8  Pain Location: L hip  Pain Descriptors / Indicators: Aching;Sore;Operative site guarding Pain Intervention(s): Limited activity within patient's tolerance;Monitored during session;Repositioned    Home Living Family/patient expects to be discharged to:: Private residence Living Arrangements: Spouse/significant other Available Help at Discharge: Family;Available 24 hours/day Type of Home: House Home Access: Stairs to enter Entrance Stairs-Rails: None Entrance Stairs-Number of Steps: 1 Home Layout: One level Home Equipment: Cane - single point;Cane - quad      Prior Function Level of Independence: Independent               Hand Dominance   Dominant Hand: Right    Extremity/Trunk Assessment   Upper Extremity Assessment Upper Extremity Assessment: Defer to OT evaluation    Lower Extremity Assessment Lower Extremity Assessment: LLE deficits/detail LLE Deficits / Details: Sensory  in tact. Deficits consistent with post op pain and weakness. Able to perform exercises below.        Communication   Communication: No difficulties  Cognition Arousal/Alertness: Awake/alert Behavior During  Therapy: WFL for tasks assessed/performed Overall Cognitive Status: Within Functional Limits for tasks assessed                                        General Comments General comments (skin integrity, edema, etc.): Pt's wife present throughout session     Exercises Total Joint Exercises Ankle Circles/Pumps: AROM;Both;15 reps;Supine Quad Sets: AROM;Left;10 reps;Supine Short Arc Quad: AROM;Left;10 reps;Supine Heel Slides: AAROM;Left;10 reps;Supine Hip ABduction/ADduction: AAROM;Left;10 reps;Supine   Assessment/Plan    PT Assessment Patient needs continued PT services  PT Problem List Decreased strength;Decreased range of motion;Decreased activity tolerance;Decreased balance;Decreased mobility;Decreased knowledge of use of DME;Decreased knowledge of precautions;Pain       PT Treatment Interventions DME instruction;Gait training;Stair training;Functional mobility training;Therapeutic exercise;Therapeutic activities;Balance training;Neuromuscular re-education;Patient/family education    PT Goals (Current goals can be found in the Care Plan section)  Acute Rehab PT Goals Patient Stated Goal: none stated  PT Goal Formulation: With patient Time For Goal Achievement: 09/12/16 Potential to Achieve Goals: Good    Frequency 7X/week   Barriers to discharge        Co-evaluation               AM-PAC PT "6 Clicks" Daily Activity  Outcome Measure Difficulty turning over in bed (including adjusting bedclothes, sheets and blankets)?: Total Difficulty moving from lying on back to sitting on the side of the bed? : Total Difficulty sitting down on and standing up from a chair with arms (e.g., wheelchair, bedside commode, etc,.)?: Total Help needed moving to and from a bed to chair (including a wheelchair)?: A Little Help needed walking in hospital room?: A Little Help needed climbing 3-5 steps with a railing? : A Little 6 Click Score: 12    End of Session Equipment  Utilized During Treatment: Gait belt;Left knee immobilizer Activity Tolerance: Treatment limited secondary to medical complications (Comment) (nausea/vomiting ) Patient left: in bed;with call bell/phone within reach;with family/visitor present;with nursing/sitter in room Nurse Communication: Mobility status;Other (comment) (nausea/vomiting ) PT Visit Diagnosis: Other abnormalities of gait and mobility (R26.89);Pain Pain - Right/Left: Left Pain - part of body: Hip    Time: 1540-1609 PT Time Calculation (min) (ACUTE ONLY): 29 min   Charges:   PT Evaluation $PT Eval Low Complexity: 1 Procedure PT Treatments $Therapeutic Exercise: 8-22 mins   PT G Codes:        Margot Chimes, PT, DPT  Acute Rehabilitation Services  Pager: [phone]   Melvyn Novas 09/05/2016, 4:40 PM

## 2016-09-05 NOTE — Anesthesia Preprocedure Evaluation (Addendum)
Anesthesia Evaluation  Patient identified by MRN, date of birth, ID band Patient awake    Reviewed: Allergy & Precautions, NPO status , Patient's Chart, lab work & pertinent test results  Airway Mallampati: I  TM Distance: >3 FB Neck ROM: Full    Dental no notable dental hx. (+) Partial Upper, Dental Advisory Given   Pulmonary neg pulmonary ROS, Current Smoker,    Pulmonary exam normal breath sounds clear to auscultation       Cardiovascular negative cardio ROS Normal cardiovascular exam Rhythm:Regular Rate:Normal     Neuro/Psych negative neurological ROS  negative psych ROS   GI/Hepatic negative GI ROS, Neg liver ROS,   Endo/Other  negative endocrine ROS  Renal/GU negative Renal ROS  negative genitourinary   Musculoskeletal negative musculoskeletal ROS (+)   Abdominal   Peds negative pediatric ROS (+)  Hematology negative hematology ROS (+)   Anesthesia Other Findings   Reproductive/Obstetrics negative OB ROS                            Anesthesia Physical Anesthesia Plan  ASA: II  Anesthesia Plan: Spinal   Post-op Pain Management:    Induction:   Airway Management Planned: Simple Face Mask  Additional Equipment:   Intra-op Plan:   Post-operative Plan:   Informed Consent: I have reviewed the patients History and Physical, chart, labs and discussed the procedure including the risks, benefits and alternatives for the proposed anesthesia with the patient or authorized representative who has indicated his/her understanding and acceptance.   Dental advisory given  Plan Discussed with: CRNA, Anesthesiologist and Surgeon  Anesthesia Plan Comments:        Anesthesia Quick Evaluation

## 2016-09-05 NOTE — Anesthesia Postprocedure Evaluation (Signed)
Anesthesia Post Note  Patient: Aaron Mejia  Procedure(s) Performed: Procedure(s) (LRB): TOTAL HIP ARTHROPLASTY (Left)  Patient location during evaluation: PACU Anesthesia Type: Spinal Level of consciousness: awake and alert Pain management: pain level controlled Vital Signs Assessment: post-procedure vital signs reviewed and stable Respiratory status: spontaneous breathing and respiratory function stable Cardiovascular status: blood pressure returned to baseline and stable Postop Assessment: no headache, no backache and spinal receding Anesthetic complications: no       Last Vitals:  Vitals:   09/05/16 1330 09/05/16 1345  BP: (!) 142/86 134/80  Pulse: (!) 58 62  Resp: 14 12  Temp:      Last Pain:  Vitals:   09/05/16 1215  TempSrc:   PainSc: 0-No pain                 Phillips Groutarignan, Degan Hanser

## 2016-09-05 NOTE — H&P (View-Only) (Signed)
TOTAL HIP ADMISSION H&P  Patient is admitted for left total hip arthroplasty.  Subjective:  Chief Complaint: left hip pain  HPI: Aaron Mejia, 66 y.o. male, has a history of pain and functional disability in the left hip(s) due to arthritis and patient has failed non-surgical conservative treatments for greater than 12 weeks to include NSAID's and/or analgesics and activity modification.  Onset of symptoms was gradual starting 5 years ago with gradually worsening course since that time.The patient noted no past surgery on the left hip(s).  Patient currently rates pain in the left hip at 8 out of 10 with activity. Patient has night pain, worsening of pain with activity and weight bearing and pain that interfers with activities of daily living. Patient has evidence of periarticular osteophytes and joint space narrowing by imaging studies. This condition presents safety issues increasing the risk of falls.There is no current active infection.  There are no active problems to display for this patient.  No past medical history on file.  No past surgical history on file.   (Not in a hospital admission) Allergies  Allergen Reactions  . Penicillins Swelling    Has never taken it was just told it makes him swell  Has patient had a PCN reaction causing immediate rash, facial/tongue/throat swelling, SOB or lightheadedness with hypotension: unknown Has patient had a PCN reaction causing severe rash involving mucus membranes or skin necrosis: unknown Has patient had a PCN reaction that required hospitalization unknown Has patient had a PCN reaction occurring within the last 10 years: No If all of the above answers are "NO", then may proceed with Cephalosporin    Social History  Substance Use Topics  . Smoking status: Never Smoker  . Smokeless tobacco: Not on file  . Alcohol use Yes    No family history on file.   Review of Systems  Musculoskeletal: Positive for joint pain.  All other  systems reviewed and are negative.   Objective:  Physical Exam  Constitutional: He is oriented to person, place, and time. He appears well-developed and well-nourished. No distress.  HENT:  Head: Normocephalic and atraumatic.  Nose: Nose normal.  Eyes: Conjunctivae and EOM are normal. Pupils are equal, round, and reactive to light.  Neck: Normal range of motion. Neck supple.  Cardiovascular: Normal rate, regular rhythm, normal heart sounds and intact distal pulses.   Respiratory: Effort normal and breath sounds normal. No respiratory distress. He has no wheezes.  GI: Soft. Bowel sounds are normal. He exhibits no distension. There is no tenderness.  Musculoskeletal:       Left hip: He exhibits decreased range of motion, tenderness and bony tenderness.  Neurological: He is alert and oriented to person, place, and time. No cranial nerve deficit.  Skin: Skin is warm and dry. No rash noted. No erythema.  Psychiatric: He has a normal mood and affect. His behavior is normal.    Vital signs in last 24 hours: @VSRANGES @  Labs:   There is no height or weight on file to calculate BMI.   Imaging Review Plain radiographs demonstrate moderate degenerative joint disease of the left hip(s). The bone quality appears to be good for age and reported activity level.  Assessment/Plan:  End stage arthritis, left hip(s)  The patient history, physical examination, clinical judgement of the provider and imaging studies are consistent with end stage degenerative joint disease of the left hip(s) and total hip arthroplasty is deemed medically necessary. The treatment options including medical management, injection therapy, arthroscopy  and arthroplasty were discussed at length. The risks and benefits of total hip arthroplasty were presented and reviewed. The risks due to aseptic loosening, infection, stiffness, dislocation/subluxation,  thromboembolic complications and other imponderables were discussed.   The patient acknowledged the explanation, agreed to proceed with the plan and consent was signed. Patient is being admitted for inpatient treatment for surgery, pain control, PT, OT, prophylactic antibiotics, VTE prophylaxis, progressive ambulation and ADL's and discharge planning.The patient is planning to be discharged home with home health services

## 2016-09-05 NOTE — Interval H&P Note (Signed)
History and Physical Interval Note:  09/05/2016 7:28 AM  Aaron Mejia  has presented today for surgery, with the diagnosis of OA LEFT HIP  The various methods of treatment have been discussed with the patient and family. After consideration of risks, benefits and other options for treatment, the patient has consented to  Procedure(s): TOTAL HIP ARTHROPLASTY (Left) as a surgical intervention .  The patient's history has been reviewed, patient examined, no change in status, stable for surgery.  I have reviewed the patient's chart and labs.  Questions were answered to the patient's satisfaction.     Krystyl Cannell JR,W D

## 2016-09-05 NOTE — Anesthesia Procedure Notes (Signed)
Spinal  Patient location during procedure: OR Staffing Anesthesiologist: Montez Hageman Performed: anesthesiologist  Preanesthetic Checklist Completed: patient identified, site marked, surgical consent, pre-op evaluation, timeout performed, IV checked, risks and benefits discussed and monitors and equipment checked Spinal Block Patient position: sitting Prep: DuraPrep Patient monitoring: heart rate, continuous pulse ox and blood pressure Approach: right paramedian Location: L2-3 Injection technique: single-shot Needle Needle type: Sprotte  Needle gauge: 25 G Needle length: 9 cm Additional Notes Expiration date of kit checked and confirmed. Patient tolerated procedure well, without complications.

## 2016-09-05 NOTE — Transfer of Care (Signed)
Immediate Anesthesia Transfer of Care Note  Patient: Aaron Mejia  Procedure(s) Performed: Procedure(s): TOTAL HIP ARTHROPLASTY (Left)  Patient Location: PACU  Anesthesia Type:Spinal  Level of Consciousness: awake, alert , oriented and patient cooperative  Airway & Oxygen Therapy: Patient Spontanous Breathing  Post-op Assessment: Report given to RN and Post -op Vital signs reviewed and stable  Post vital signs: Reviewed and stable  Last Vitals:  Vitals:   09/05/16 0556  BP: (!) 152/82  Resp: 18  Temp: 36.6 C    Last Pain:  Vitals:   09/05/16 0556  TempSrc: Oral  PainSc:       Patients Stated Pain Goal: 5 (09/05/16 0549)  Complications: No apparent anesthesia complications

## 2016-09-05 NOTE — Brief Op Note (Signed)
09/05/2016  9:34 AM  PATIENT:  Aaron Mejia  66 y.o. male  PRE-OPERATIVE DIAGNOSIS:  OA LEFT HIP  POST-OPERATIVE DIAGNOSIS:  OA LEFT HIP  PROCEDURE:  Procedure(s): TOTAL HIP ARTHROPLASTY (Left)  SURGEON:  Surgeon(s) and Role:    Frederico Hamman* Caffrey, Daniel, MD - Primary  PHYSICIAN ASSISTANT: Margart SicklesJoshua Lateia Fraser, PA-C  ASSISTANTS: OR staff x1  ANESTHESIA:   local, spinal and IV sedation  EBL:  Total I/O In: 700 [I.V.:700] Out: 100 [Blood:100]  BLOOD ADMINISTERED:none  DRAINS: none   LOCAL MEDICATIONS USED:  MARCAINE    and OTHER exparel  SPECIMEN:  No Specimen  DISPOSITION OF SPECIMEN:  N/A  COUNTS:  YES  TOURNIQUET:  * No tourniquets in log *  DICTATION: .Other Dictation: Dictation Number unknown  PLAN OF CARE: Admit to inpatient   PATIENT DISPOSITION:  PACU - hemodynamically stable.   Delay start of Pharmacological VTE agent (>24hrs) due to surgical blood loss or risk of bleeding: yes

## 2016-09-06 LAB — BASIC METABOLIC PANEL
Anion gap: 5 (ref 5–15)
BUN: 14 mg/dL (ref 6–20)
CHLORIDE: 103 mmol/L (ref 101–111)
CO2: 27 mmol/L (ref 22–32)
Calcium: 8.5 mg/dL — ABNORMAL LOW (ref 8.9–10.3)
Creatinine, Ser: 1.27 mg/dL — ABNORMAL HIGH (ref 0.61–1.24)
GFR calc Af Amer: >60 mL/min (ref >60)
GFR calc non Af Amer: 58 mL/min — ABNORMAL LOW (ref >60)
GLUCOSE: 126 mg/dL — AB (ref 65–99)
POTASSIUM: 4.1 mmol/L (ref 3.5–5.1)
Sodium: 135 mmol/L (ref 135–145)

## 2016-09-06 LAB — CBC
HEMATOCRIT: 33.7 % — AB (ref 39.0–52.0)
HEMOGLOBIN: 11 g/dL — AB (ref 13.0–17.0)
MCH: 27.4 pg (ref 26.0–34.0)
MCHC: 32.6 g/dL (ref 30.0–36.0)
MCV: 84 fL (ref 78.0–100.0)
Platelets: 212 10*3/uL (ref 150–400)
RBC: 4.01 MIL/uL — AB (ref 4.22–5.81)
RDW: 13.6 % (ref 11.5–15.5)
WBC: 6.6 10*3/uL (ref 4.0–10.5)

## 2016-09-06 MED ORDER — SENNOSIDES-DOCUSATE SODIUM 8.6-50 MG PO TABS: 1.0000 | ORAL_TABLET | Freq: Every day | ORAL | 0 refills | Status: DC

## 2016-09-06 NOTE — Evaluation (Signed)
Occupational Therapy Evaluation Patient Details Name: Aaron Mejia MRN: [id number] DOB: [birth year] Today's Date: 09/06/2016    History of Present Illness Pt is a 66 y/o male s/p elective L THA secondary to L hip OA. PMH includes current smoker.    Clinical Impression   PTA, pt was independent and living with his wife. Currently, pt requires Min A for functional mobility, transfers, and LB ADLs. Pt recalled 3/3 hip precautions and adhered to precautions throughout session. Educated pt on AE for LB ADLs and tub transfer using 3N1. Provided handouts and pt demonstrated understanding. Recommend dc home once medically stable per physician. Answered all pt and family questions. All acute OT need met. Will sign off. Thank you.     Follow Up Recommendations  No OT follow up;Supervision/Assistance - 24 hour    Equipment Recommendations  3 in 1 bedside commode    Recommendations for Other Services       Precautions / Restrictions Precautions Precautions: Posterior Hip Precaution Booklet Issued: Yes (comment) Precaution Comments: Reviewed posterior total hip precautions and also supine ther ex with pt.  (Pt recalled 3/3 precautions) Required Braces or Orthoses: Knee Immobilizer - Left Knee Immobilizer - Left: Other (comment) (until discontinued ) Restrictions Weight Bearing Restrictions: Yes LLE Weight Bearing: Weight bearing as tolerated      Mobility Bed Mobility               General bed mobility comments: In recliner upon arrival  Transfers Overall transfer level: Needs assistance Equipment used: Rolling walker (2 wheeled) Transfers: Sit to/from Stand Sit to Stand: Min assist         General transfer comment: Min A for managing RW and provided Min VCs for squencing of steps with RW    Balance Overall balance assessment: Needs assistance Sitting-balance support: Feet supported;No upper extremity supported Sitting balance-Leahy Scale: Normal     Standing  balance support: Bilateral upper extremity supported;During functional activity Standing balance-Leahy Scale: Fair Standing balance comment: Reliant on RW for standing balance                            ADL either performed or assessed with clinical judgement   ADL Overall ADL's : Needs assistance/impaired Eating/Feeding: Set up;Sitting   Grooming: Min guard;Standing   Upper Body Bathing: Supervision/ safety;Set up;Sitting   Lower Body Bathing: Minimal assistance;With caregiver independent assisting;Sit to/from stand;With adaptive equipment Lower Body Bathing Details (indicate cue type and reason): Educated pt on AE for LB bathing Upper Body Dressing : Set up;Supervision/safety;Sitting   Lower Body Dressing: With caregiver independent assisting;With adaptive equipment;Sit to/from stand;Minimal assistance Lower Body Dressing Details (indicate cue type and reason): Pt already dressed. educated him on AE for lb dressing. Pt practice donning/doffing socks with sock aide and reacher; he demosntrated good understanding Toilet Transfer: Minimal assistance;BSC;Ambulation;RW;Grab bars   Toileting- Clothing Manipulation and Hygiene: Min guard;Sit to/from stand   Tub/ Shower Transfer: Tub transfer;3 in 1;Ambulation;Rolling walker;Cueing for safety;Cueing for sequencing;Adhering to hip precautions;Min guard Tub/Shower Transfer Details (indicate cue type and reason): Pt performed tub transfer with wife present. Provided hand out Functional mobility during ADLs: Min guard;Rolling walker (Cues for sequencing of steps and managing RW) General ADL Comments: Pt demonstrating good progress. Demonstrated good understanding of education and precautions     Vision         Perception     Praxis      Pertinent Vitals/Pain Pain Assessment: Faces Faces Pain  Scale: Hurts even more Pain Location: L hip  Pain Descriptors / Indicators: Aching;Sore;Operative site guarding Pain Intervention(s):  Monitored during session     Hand Dominance Right   Extremity/Trunk Assessment Upper Extremity Assessment Upper Extremity Assessment: Overall WFL for tasks assessed   Lower Extremity Assessment Lower Extremity Assessment: Defer to PT evaluation LLE Deficits / Details: Sensory in tact. Deficits consistent with post op pain and weakness. Able to perform exercises below.    Cervical / Trunk Assessment Cervical / Trunk Assessment: Normal   Communication Communication Communication: No difficulties   Cognition Arousal/Alertness: Awake/alert Behavior During Therapy: WFL for tasks assessed/performed Overall Cognitive Status: Within Functional Limits for tasks assessed                                     General Comments  Wife present for session. Provided handout on tub transfer and AE for LB dressing. Provided information on where to get AE    Exercises     Shoulder Instructions      Home Living Family/patient expects to be discharged to:: Private residence Living Arrangements: Spouse/significant other Available Help at Discharge: Family;Available 24 hours/day Type of Home: House Home Access: Stairs to enter [employer] of Steps: 1 Entrance Stairs-Rails: None Home Layout: One level     Bathroom Shower/Tub: Teacher, early years/pre: Handicapped height     Home Equipment: Cane - single point;Cane - quad          Prior Functioning/Environment Level of Independence: Independent        Comments: Enjoys playing pool        OT Problem List: Decreased strength;Decreased range of motion;Decreased activity tolerance;Impaired balance (sitting and/or standing);Decreased safety awareness;Decreased knowledge of use of DME or AE;Decreased knowledge of precautions;Pain      OT Treatment/Interventions:      OT Goals(Current goals can be found in the care plan section) Acute Rehab OT Goals Patient Stated Goal: Go home today  (Saturday) OT Goal Formulation: With patient Time For Goal Achievement: 09/20/16 Potential to Achieve Goals: Good  OT Frequency:     Barriers to D/C:            Co-evaluation              AM-PAC PT "6 Clicks" Daily Activity     Outcome Measure Help from another person eating meals?: None Help from another person taking care of personal grooming?: A Little Help from another person toileting, which includes using toliet, bedpan, or urinal?: A Little Help from another person bathing (including washing, rinsing, drying)?: A Lot Help from another person to put on and taking off regular upper body clothing?: None Help from another person to put on and taking off regular lower body clothing?: A Lot 6 Click Score: 18   End of Session Equipment Utilized During Treatment: Gait belt;Rolling walker;Left knee immobilizer Nurse Communication: Mobility status;Precautions;Weight bearing status  Activity Tolerance: Patient tolerated treatment well Patient left: with call bell/phone within reach;Other (comment) (At toilet for BM. notified NT. Checked back)  OT Visit Diagnosis: Unsteadiness on feet (R26.81);Muscle weakness (generalized) (M62.81);Pain;Other abnormalities of gait and mobility (R26.89) Pain - Right/Left: Left Pain - part of body: Leg                Time: 0930-1001 OT Time Calculation (min): 31 min Charges:  OT General Charges $OT Visit: 1 Procedure OT Evaluation $OT Eval Low  Complexity: 1 Procedure OT Treatments $Self Care/Home Management : 8-22 mins G-Codes:     [employer], OTR/L [phone]  Heywood Footman [place] 09/06/2016, 10:51 AM

## 2016-09-06 NOTE — Care Management Note (Addendum)
Case Management Note  Patient Details  Name: Aaron Mejia MRN: [id number] Date of Birth: Sep 01, 1950  Subjective/Objective:                 Spoke with patient, [facility] and for DME RW and 3/1 to be delivered to room prior to DC.  Addendum- Not previously documented that [facility], nor was patient even aware when CM spoke with him. Patient also stated that he did not have any DME. Therefore [facility]. [facility] now [facility].    Action/Plan:   Expected Discharge Date:  09/06/16               Expected Discharge Plan:  Home w Home Health Services  In-House Referral:     Discharge planning Services  CM Consult  Post Acute Care Choice:  Durable Medical Equipment, Home Health Choice offered to:  Patient  DME Arranged:  3-N-1, Walker rolling DME Agency:  [facility]  HH Arranged:    HH Agency:  [facility]  Status of Service:  Completed, signed off  If discussed at Long Length of Stay Meetings, dates discussed:    Additional Comments:  Lawerance SabalDebbie Brendan Gruwell, RN 09/06/2016, 12:01 PM

## 2016-09-06 NOTE — Progress Notes (Addendum)
CM received call from [facility]/Wainer PA stating pt is [facility] and [facility] is providing all DME.  CM called [facility] rep, jermaine to please cancel referral.  CM notified [facility] and [facility] of pt discharge. [employer] request I find another vendor for Rolling walker and 3n1 as pt is not needed CPM; CM notified [facility] rep, Reggie to please deliver the DMe to room so pt can discharge. No other CM needs were communicated.

## 2016-09-06 NOTE — Discharge Instructions (Signed)
INSTRUCTIONS AFTER JOINT REPLACEMENT  ° °o Remove items at home which could result in a fall. This includes throw rugs or furniture in walking pathways °o ICE to the affected joint every three hours while awake for 30 minutes at a time, for at least the first 3-5 days, and then as needed for pain and swelling.  Continue to use ice for pain and swelling. You may notice swelling that will progress down to the foot and ankle.  This is normal after surgery.  Elevate your leg when you are not up walking on it.   °o Continue to use the breathing machine you got in the hospital (incentive spirometer) which will help keep your temperature down.  It is common for your temperature to cycle up and down following surgery, especially at night when you are not up moving around and exerting yourself.  The breathing machine keeps your lungs expanded and your temperature down. ° ° °DIET:  As you were doing prior to hospitalization, we recommend a well-balanced diet. ° °DRESSING / WOUND CARE / SHOWERING ° °You may change your dressing 3-5 days after surgery.  Then change the dressing every day with sterile gauze.  Please use good hand washing techniques before changing the dressing.  Do not use any lotions or creams on the incision until instructed by your surgeon. ° °ACTIVITY ° °o Increase activity slowly as tolerated, but follow the weight bearing instructions below.   °o No driving for 6 weeks or until further direction given by your physician.  You cannot drive while taking narcotics.  °o No lifting or carrying greater than 10 lbs. until further directed by your surgeon. °o Avoid periods of inactivity such as sitting longer than an hour when not asleep. This helps prevent blood clots.  °o You may return to work once you are authorized by your doctor.  ° ° ° °WEIGHT BEARING  ° °Weight bearing as tolerated with assist device (walker, cane, etc) as directed, use it as long as suggested by your surgeon or therapist, typically at  least 4-6 weeks. ° ° °EXERCISES ° °Results after joint replacement surgery are often greatly improved when you follow the exercise, range of motion and muscle strengthening exercises prescribed by your doctor. Safety measures are also important to protect the joint from further injury. Any time any of these exercises cause you to have increased pain or swelling, decrease what you are doing until you are comfortable again and then slowly increase them. If you have problems or questions, call your caregiver or physical therapist for advice.  ° °Rehabilitation is important following a joint replacement. After just a few days of immobilization, the muscles of the leg can become weakened and shrink (atrophy).  These exercises are designed to build up the tone and strength of the thigh and leg muscles and to improve motion. Often times heat used for twenty to thirty minutes before working out will loosen up your tissues and help with improving the range of motion but do not use heat for the first two weeks following surgery (sometimes heat can increase post-operative swelling).  ° °These exercises can be done on a training (exercise) mat, on the floor, on a table or on a bed. Use whatever works the best and is most comfortable for you.    Use music or television while you are exercising so that the exercises are a pleasant break in your day. This will make your life better with the exercises acting as a break   in your routine that you can look forward to.   Perform all exercises about fifteen times, three times per day or as directed.  You should exercise both the operative leg and the other leg as well. ° °Exercises include: °  °• Quad Sets - Tighten up the muscle on the front of the thigh (Quad) and hold for 5-10 seconds.   °• Straight Leg Raises - With your knee straight (if you were given a brace, keep it on), lift the leg to 60 degrees, hold for 3 seconds, and slowly lower the leg.  Perform this exercise against  resistance later as your leg gets stronger.  °• Leg Slides: Lying on your back, slowly slide your foot toward your buttocks, bending your knee up off the floor (only go as far as is comfortable). Then slowly slide your foot back down until your leg is flat on the floor again.  °• Angel Wings: Lying on your back spread your legs to the side as far apart as you can without causing discomfort.  °• Hamstring Strength:  Lying on your back, push your heel against the floor with your leg straight by tightening up the muscles of your buttocks.  Repeat, but this time bend your knee to a comfortable angle, and push your heel against the floor.  You may put a pillow under the heel to make it more comfortable if necessary.  ° °A rehabilitation program following joint replacement surgery can speed recovery and prevent re-injury in the future due to weakened muscles. Contact your doctor or a physical therapist for more information on knee rehabilitation.  ° ° °CONSTIPATION ° °Constipation is defined medically as fewer than three stools per week and severe constipation as less than one stool per week.  Even if you have a regular bowel pattern at home, your normal regimen is likely to be disrupted due to multiple reasons following surgery.  Combination of anesthesia, postoperative narcotics, change in appetite and fluid intake all can affect your bowels.  ° °YOU MUST use at least one of the following options; they are listed in order of increasing strength to get the job done.  They are all available over the counter, and you may need to use some, POSSIBLY even all of these options:   ° °Drink plenty of fluids (prune juice may be helpful) and high fiber foods °Colace 100 mg by mouth twice a day  °Senokot for constipation as directed and as needed Dulcolax (bisacodyl), take with full glass of water  °Miralax (polyethylene glycol) once or twice a day as needed. ° °If you have tried all these things and are unable to have a bowel  movement in the first 3-4 days after surgery call either your surgeon or your primary doctor.   ° °If you experience loose stools or diarrhea, hold the medications until you stool forms back up.  If your symptoms do not get better within 1 week or if they get worse, check with your doctor.  If you experience "the worst abdominal pain ever" or develop nausea or vomiting, please contact the office immediately for further recommendations for treatment. ° ° °ITCHING:  If you experience itching with your medications, try taking only a single pain pill, or even half a pain pill at a time.  You can also use Benadryl over the counter for itching or also to help with sleep.  ° °TED HOSE STOCKINGS:  Use stockings on both legs until for at least 2 weeks or as   directed by physician office. They may be removed at night for sleeping. ° °MEDICATIONS:  See your medication summary on the “After Visit Summary” that nursing will review with you.  You may have some home medications which will be placed on hold until you complete the course of blood thinner medication.  It is important for you to complete the blood thinner medication as prescribed. ° °PRECAUTIONS:  If you experience chest pain or shortness of breath - call 911 immediately for transfer to the hospital emergency department.  ° °If you develop a fever greater that 101 F, purulent drainage from wound, increased redness or drainage from wound, foul odor from the wound/dressing, or calf pain - CONTACT YOUR SURGEON.   °                                                °FOLLOW-UP APPOINTMENTS:  If you do not already have a post-op appointment, please call the office for an appointment to be seen by your surgeon.  Guidelines for how soon to be seen are listed in your “After Visit Summary”, but are typically between 1-4 weeks after surgery. ° °OTHER INSTRUCTIONS:  ° °Knee Replacement:  Do not place pillow under knee, focus on keeping the knee straight while resting. CPM  instructions: 0-90 degrees, 2 hours in the morning, 2 hours in the afternoon, and 2 hours in the evening. Place foam block, curve side up under heel at all times except when in CPM or when walking.  DO NOT modify, tear, cut, or change the foam block in any way. ° °MAKE SURE YOU:  °• Understand these instructions.  °• Get help right away if you are not doing well or get worse.  ° ° °Thank you for letting us be a part of your medical care team.  It is a privilege we respect greatly.  We hope these instructions will help you stay on track for a fast and full recovery!  ° ° ° ° ° °Information on my medicine - ELIQUIS® (apixaban) ° °Why was Eliquis® prescribed for you? °Eliquis® was prescribed for you to reduce the risk of blood clots forming after orthopedic surgery.   ° °What do You need to know about Eliquis®? °Take your Eliquis® TWICE DAILY - one tablet in the morning and one tablet in the evening with or without food.  It would be best to take the dose about the same time each day. ° °If you have difficulty swallowing the tablet whole please discuss with your pharmacist how to take the medication safely. ° °Take Eliquis® exactly as prescribed by your doctor and DO NOT stop taking Eliquis® without talking to the doctor who prescribed the medication.  Stopping without other medication to take the place of Eliquis® may increase your risk of developing a clot. ° °After discharge, you should have regular check-up appointments with your healthcare provider that is prescribing your Eliquis®. ° °What do you do if you miss a dose? °If a dose of ELIQUIS® is not taken at the scheduled time, take it as soon as possible on the same day and twice-daily administration should be resumed.  The dose should not be doubled to make up for a missed dose.  Do not take more than one tablet of ELIQUIS at the same time. ° °Important Safety Information °A possible side effect of Eliquis®   is bleeding. You should call your healthcare provider  right away if you experience any of the following: °? Bleeding from an injury or your nose that does not stop. °? Unusual colored urine (red or dark brown) or unusual colored stools (red or black). °? Unusual bruising for unknown reasons. °? A serious fall or if you hit your head (even if there is no bleeding). ° °Some medicines may interact with Eliquis® and might increase your risk of bleeding or clotting while on Eliquis®. To help avoid this, consult your healthcare provider or pharmacist prior to using any new prescription or non-prescription medications, including herbals, vitamins, non-steroidal anti-inflammatory drugs (NSAIDs) and supplements. ° °This website has more information on Eliquis® (apixaban): http://www.eliquis.com/eliquis/home ° ° °

## 2016-09-06 NOTE — Progress Notes (Signed)
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 PMH includes current smoker.     PT Comments    Pt progressing towards goals. Con't to have c/o of stiffness and soreness in L hip limiting active ROM into flexion due to onset of pain. Pt with improved gait pattern this date. Acute PT to con't to follow.   Follow Up Recommendations  Home health PT;Supervision/Assistance - 24 hour     Equipment Recommendations  Rolling walker with 5" wheels;3in1 (PT)    Recommendations for Other Services       Precautions / Restrictions Precautions Precautions: Posterior Hip Precaution Booklet Issued: Yes (comment) Precaution Comments: Reviewed posterior total hip precautions and also supine ther ex with pt.  Required Braces or Orthoses: Knee Immobilizer - Left Knee Immobilizer - Left:  (when in bed) Restrictions Weight Bearing Restrictions: Yes LLE Weight Bearing: Weight bearing as tolerated    Mobility  Bed Mobility               General bed mobility comments: pt up in chair  Transfers Overall transfer level: Needs assistance Equipment used: Rolling walker (2 wheeled) Transfers: Sit to/from Stand Sit to Stand: Supervision         General transfer comment: pt with increased hip/knee flexion and able to push up from chair in more fluid motion  Ambulation/Gait Ambulation/Gait assistance: Min guard Ambulation Distance (Feet): 200 Feet Assistive device: Rolling walker (2 wheeled) Gait Pattern/deviations: Step-through pattern;Decreased stride length;Narrow base of support Gait velocity: slow Gait velocity interpretation: Below normal speed for age/gender General Gait Details: pt remains very guarded and cautious but had more fluidity than this morning   Stairs            Wheelchair Mobility    Modified  Rankin (Stroke Patients Only)       Balance Overall balance assessment: Needs assistance Sitting-balance support: Feet supported;No upper extremity supported Sitting balance-Leahy Scale: Normal     Standing balance support: During functional activity;No upper extremity supported Standing balance-Leahy Scale: Fair Standing balance comment: pt able to stand in front of commode and pull down pants                            Cognition Arousal/Alertness: Awake/alert Behavior During Therapy: WFL for tasks assessed/performed Overall Cognitive Status: Within Functional Limits for tasks assessed                                        Exercises Total Joint Exercises Heel Slides: AROM;Left;10 reps;Seated (with back reclined to mimic supine and adhere to precautions)    General Comments        Pertinent Vitals/Pain Pain Assessment: 0-10 Pain Score: 7  Pain Location: L hip Pain Descriptors / Indicators: Sore Pain Intervention(s): Monitored during session    Home Living                      Prior Function            PT Goals (current goals can now be found in the care plan section) Acute Rehab PT Goals Patient Stated Goal: home Progress towards PT goals: Progressing toward goals  Frequency    7X/week      PT Plan Current plan remains appropriate    Co-evaluation              AM-PAC PT "6 Clicks" Daily Activity  Outcome Measure  Difficulty turning over in bed (including adjusting bedclothes, sheets and blankets)?: A Little Difficulty moving from lying on back to sitting on the side of the bed? : A Little Difficulty sitting down on and standing up from a chair with arms (e.g., wheelchair, bedside commode, etc,.)?: A Little Help needed moving to and from a bed to chair (including a wheelchair)?: A Little Help needed walking in hospital room?: A Little Help needed climbing 3-5 steps with a railing? : A Little 6 Click  Score: 18    End of Session Equipment Utilized During Treatment: Gait belt Activity Tolerance: Patient tolerated treatment well Patient left: with call bell/phone within reach;with family/visitor present (on commode) Nurse Communication: Mobility status PT Visit Diagnosis: Other abnormalities of gait and mobility (R26.89);Pain Pain - Right/Left: Left Pain - part of body: Hip     Time: [id number] PT Time Calculation (min) (ACUTE ONLY): 19 min  Charges:  $Gait Training: 8-22 mins                    G Codes:      Lewis ShockAshly Tajha Sammarco, PT, DPT Pager #: [phone] Office #: [phone]    Stephanee Barcomb M Denette Hass 09/06/2016, 3:30 PM

## 2016-09-06 NOTE — Progress Notes (Signed)
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 PMH includes current smoker.     PT Comments    Pt with increased pain and stiffness but was able to amb 200' with RW and complete stair negotiation. Educated pt on HEP again and encouraged to complete 3x/day.   Follow Up Recommendations  Home health PT;Supervision/Assistance - 24 hour     Equipment Recommendations  Rolling walker with 5" wheels;3in1 (PT)    Recommendations for Other Services       Precautions / Restrictions Precautions Precautions: Posterior Hip Precaution Booklet Issued: Yes (comment) Precaution Comments: Reviewed posterior total hip precautions and also supine ther ex with pt.  Required Braces or Orthoses: Knee Immobilizer - Left Knee Immobilizer - Left:  (on when in bed) Restrictions Weight Bearing Restrictions: Yes LLE Weight Bearing: Weight bearing as tolerated    Mobility  Bed Mobility               General bed mobility comments: pt up in recliner upon PT arrival  Transfers Overall transfer level: Needs assistance Equipment used: Rolling walker (2 wheeled) Transfers: Sit to/from Stand Sit to Stand: Min guard         General transfer comment: v/c's for hand placement, increased time, resistant to hip/knee flexion due to pain  Ambulation/Gait Ambulation/Gait assistance: Min guard Ambulation Distance (Feet): 200 Feet Assistive device: Rolling walker (2 wheeled) Gait Pattern/deviations: Step-to pattern Gait velocity: slow Gait velocity interpretation: Below normal speed for age/gender General Gait Details: pt very guarded and cautious, limited hip/knee flexion due to pain despite v/c's. pt with L LE hip hike to clear L foot. pt c/o lightheadedness from the pain meds   Stairs Stairs: Yes   Stair Management: With  walker;Backwards Number of Stairs: 1 (platform step) General stair comments: pt with 1 high step into house, practiced backwards and forwards, dtr present  Wheelchair Mobility    Modified Rankin (Stroke Patients Only)       Balance Overall balance assessment: Needs assistance Sitting-balance support: Feet supported;No upper extremity supported Sitting balance-Leahy Scale: Normal     Standing balance support: Bilateral upper extremity supported;During functional activity Standing balance-Leahy Scale: Fair Standing balance comment: Reliant on RW for standing balance                             Cognition Arousal/Alertness: Awake/alert Behavior During Therapy: WFL for tasks assessed/performed Overall Cognitive Status: Within Functional Limits for tasks assessed                                        Exercises Total Joint Exercises Ankle Circles/Pumps: AROM;Both;10 reps;Seated Quad Sets: AROM;Left;10 reps;Seated Marching in Standing: AAROM;Left;15 reps;Standing (progressed to about 60 deg)    General Comments General comments (skin integrity, edema, etc.): Wife present for session. Provided handout on tub transfer and AE for LB dressing. Provided information on where to get AE      Pertinent Vitals/Pain Pain Assessment: 0-10 Pain Score: 8  Faces Pain Scale: Hurts even more Pain Location: L hip Pain Descriptors / Indicators: Sore Pain Intervention(s): Monitored during session    Home Living Family/patient expects to be discharged to:: Private residence Living Arrangements: Spouse/significant  other Available Help at Discharge: Family;Available 24 hours/day Type of Home: House Home Access: Stairs to enter Entrance Stairs-Rails: None Home Layout: One level Home Equipment: Cane - single point;Cane - quad      Prior Function Level of Independence: Independent      Comments: Enjoys playing pool   PT Goals (current goals can now be found in  the care plan section) Acute Rehab PT Goals Patient Stated Goal: hom Progress towards PT goals: Progressing toward goals    Frequency    7X/week      PT Plan Current plan remains appropriate    Co-evaluation              AM-PAC PT "6 Clicks" Daily Activity  Outcome Measure  Difficulty turning over in bed (including adjusting bedclothes, sheets and blankets)?: A Little Difficulty moving from lying on back to sitting on the side of the bed? : A Little Difficulty sitting down on and standing up from a chair with arms (e.g., wheelchair, bedside commode, etc,.)?: A Little Help needed moving to and from a bed to chair (including a wheelchair)?: A Little Help needed walking in hospital room?: A Little Help needed climbing 3-5 steps with a railing? : A Little 6 Click Score: 18    End of Session Equipment Utilized During Treatment: Gait belt Activity Tolerance: Patient tolerated treatment well Patient left: in chair;with call bell/phone within reach;with family/visitor present Nurse Communication: Mobility status PT Visit Diagnosis: Other abnormalities of gait and mobility (R26.89);Pain Pain - Right/Left: Left Pain - part of body: Hip     Time: 3329-5188 PT Time Calculation (min) (ACUTE ONLY): 31 min  Charges:  $Gait Training: 8-22 mins $Therapeutic Exercise: 8-22 mins                    G Codes:       Lewis Shock, PT, DPT Pager #: [phone] Office #: [phone]    Barnie Sopko M Arayna Illescas 09/06/2016, 11:31 AM

## 2016-09-07 NOTE — Progress Notes (Signed)
CM received callback from [facility] rep, Eber JonesCarolyn who states they have no record of pt for home health services and cannot provide staffing.  Eber JonesCarolyn states she will contact [facility] office to notify [facility]  Cm called family and gave family [facility] contact information.  Cm notified [facility] rep, Jermaine to please arrange for home health services and to contact family today to schedule start of care. No other CM needs were communicated.

## 2016-09-09 ENCOUNTER — Encounter (HOSPITAL_COMMUNITY): Payer: Self-pay | Admitting: Orthopedic Surgery

## 2016-09-17 NOTE — Discharge Summary (Signed)
Patient ID: Aaron Mejia MRN: [id number] DOB/AGE: 65-Nov-1952 65 y.o.  Admit date: 09/05/2016 Discharge date: 09/06/2016  Admission Diagnoses:  Active Problems:   Osteoarthritis of left hip   Discharge Diagnoses:  Same  Past Medical History:  Diagnosis Date  . Anemia   . Arthritis   . GERD (gastroesophageal reflux disease)     Surgeries: Procedure(s): TOTAL HIP ARTHROPLASTY on 09/05/2016   Consultants:   Discharged Condition: Improved  Hospital Course: Aaron Mejia is an 66 y.o. male who was admitted 09/05/2016 for operative treatment of<principal problem not specified>. Patient has severe unremitting pain that affects sleep, daily activities, and work/hobbies. After pre-op clearance the patient was taken to the operating room on 09/05/2016 and underwent  Procedure(s): TOTAL HIP ARTHROPLASTY.    Patient was given perioperative antibiotics:  Anti-infectives    Start     Dose/Rate Route Frequency Ordered Stop   09/05/16 1800  vancomycin (VANCOCIN) IVPB 1000 mg/200 mL premix     1,000 mg 200 mL/hr over 60 Minutes Intravenous Every 12 hours 09/05/16 1306 09/05/16 1810   09/05/16 0546  vancomycin (VANCOCIN) 1-5 GM/200ML-% IVPB    Comments:  Scronce, Trina   : cabinet override      09/05/16 0546 09/05/16 0745   09/05/16 0542  vancomycin (VANCOCIN) IVPB 1000 mg/200 mL premix     1,000 mg 200 mL/hr over 60 Minutes Intravenous On call to O.R. 09/05/16 0542 09/05/16 0845       Patient was given sequential compression devices, early ambulation, and chemoprophylaxis to prevent DVT.  Patient benefited maximally from hospital stay and there were no complications.    Recent vital signs: No data found.    Recent laboratory studies: No results for input(s): WBC, HGB, HCT, PLT, NA, K, CL, CO2, BUN, CREATININE, GLUCOSE, INR, CALCIUM in the last 72 hours.  Invalid input(s): PT, 2   Discharge Medications:   Allergies as of 09/06/2016      Reactions   Penicillins Swelling    Has never taken it was just told it makes him swell  Has patient had a PCN reaction causing immediate rash, facial/tongue/throat swelling, SOB or lightheadedness with hypotension: unknown Has patient had a PCN reaction causing severe rash involving mucus membranes or skin necrosis: unknown Has patient had a PCN reaction that required hospitalization unknown Has patient had a PCN reaction occurring within the last 10 years: No If all of the above answers are "NO", then may proceed with Cephalosporin      Medication List    STOP taking these medications   azithromycin 250 MG tablet Commonly known as:  ZITHROMAX   cyclobenzaprine 10 MG tablet Commonly known as:  FLEXERIL   etodolac 400 MG tablet Commonly known as:  LODINE     TAKE these medications   AEROCHAMBER PLUS inhaler Use as instructed   albuterol 108 (90 Base) MCG/ACT inhaler Commonly known as:  PROVENTIL HFA;VENTOLIN HFA Inhale 1-2 puffs into the lungs every 6 (six) hours as needed for wheezing or shortness of breath. What changed:  how much to take   apixaban 2.5 MG Tabs tablet Commonly known as:  ELIQUIS Take 1 tablet (2.5 mg total) by mouth 2 (two) times daily.   guaiFENesin-codeine 100-10 MG/5ML syrup Commonly known as:  CHERATUSSIN AC Take 10 mLs by mouth 4 (four) times daily as needed for cough.   ipratropium 0.06 % nasal spray Commonly known as:  ATROVENT Place 2 sprays into both nostrils 4 (four) times daily. 3-4 times/ day  What changed:  when to take this  reasons to take this  additional instructions   oxyCODONE-acetaminophen 5-325 MG tablet Commonly known as:  ROXICET Take 1-2 tablets by mouth every 4 (four) hours as needed for moderate pain or severe pain.   REFRESH OPTIVE 1-0.9 % Gel Generic drug:  Carboxymethylcellul-Glycerin Apply 1 application to eye daily.   senna-docusate 8.6-50 MG tablet Commonly known as:  Senokot-S Take 1 tablet by mouth at bedtime.   XIIDRA 5 % Soln Generic  drug:  Lifitegrast Apply 1 drop to eye daily.       Diagnostic Studies: Dg Chest 2 View  Result Date: 08/25/2016 CLINICAL DATA:  Osteoarthritis. EXAM: CHEST  2 VIEW COMPARISON:  No prior. FINDINGS: Mediastinum and hilar structures normal. Lungs are clear. Heart size normal. No pleural effusion or pneumothorax. No acute bony abnormality . Thoracic spine scoliosis. IMPRESSION: No acute cardiopulmonary disease . Electronically Signed   By: Maisie Fushomas  Register   On: 08/25/2016 11:35   Dg Pelvis Portable  Result Date: 09/05/2016 CLINICAL DATA:  Left hip replacement. EXAM: PORTABLE PELVIS 1-2 VIEWS COMPARISON:  No prior. FINDINGS: Total left hip replacement. Anatomic alignment. Hardware intact. No acute bony abnormality. IMPRESSION: Total left hip replacement with anatomic alignment. Electronically Signed   By: Maisie Fushomas  Register   On: 09/05/2016 10:11   Dg Hip Port Unilat With Pelvis 1v Left  Result Date: 09/05/2016 CLINICAL DATA:  Post left hip replacement EXAM: DG HIP (WITH OR WITHOUT PELVIS) 1V PORT LEFT COMPARISON:  06/13/2016 FINDINGS: Changes of left hip replacement. Normal alignment. No hardware or bony complicating feature. IMPRESSION: Left hip replacement.  No complicating feature. Electronically Signed   By: Charlett NoseKevin  Dover M.D.   On: 09/05/2016 10:11    Disposition: 01-Home or Self Care    Follow-up Information    Frederico Hammanaffrey, Daniel, MD. Schedule an appointment as soon as possible for a visit in 2 week(s).   Specialty:  Orthopedic Surgery Contact information: [address] [place] [address] [phone]        Care, [facility] Follow up.   Specialty:  Home Health Services Why:  home health agency Contact information: [address] [place] [address] [phone]        [facility], [facility] Follow up.   Why:  your home health agency Contact information: [address] [place] [address] [phone]             Signed: Pascal LuxSHEPPERSON,Christalynn Boise J 09/17/2016, 7:21 AM

## 2019-02-09 DIAGNOSIS — Z96642 Presence of left artificial hip joint: Secondary | ICD-10-CM | POA: Diagnosis not present

## 2019-02-09 DIAGNOSIS — M25551 Pain in right hip: Secondary | ICD-10-CM | POA: Diagnosis not present

## 2019-02-09 DIAGNOSIS — K0889 Other specified disorders of teeth and supporting structures: Secondary | ICD-10-CM | POA: Diagnosis not present

## 2020-01-12 ENCOUNTER — Ambulatory Visit
Admission: RE | Admit: 2020-01-12 | Discharge: 2020-01-12 | Disposition: A | Discharge status: Home / Self Care | Payer: Medicare Other | Source: Ambulatory Visit | Attending: General Practice | Admitting: General Practice

## 2020-01-12 ENCOUNTER — Other Ambulatory Visit: Payer: Self-pay | Admitting: General Practice

## 2020-01-12 DIAGNOSIS — M62838 Other muscle spasm: Secondary | ICD-10-CM

## 2020-02-23 ENCOUNTER — Other Ambulatory Visit: Payer: Self-pay

## 2020-02-23 ENCOUNTER — Ambulatory Visit: Payer: Medicare Other | Attending: General Practice | Admitting: Physical Therapy

## 2020-02-23 ENCOUNTER — Encounter: Payer: Self-pay | Admitting: Physical Therapy

## 2020-02-23 DIAGNOSIS — M25511 Pain in right shoulder: Secondary | ICD-10-CM | POA: Insufficient documentation

## 2020-02-23 DIAGNOSIS — M542 Cervicalgia: Secondary | ICD-10-CM | POA: Insufficient documentation

## 2020-02-23 DIAGNOSIS — G8929 Other chronic pain: Secondary | ICD-10-CM | POA: Insufficient documentation

## 2020-02-23 DIAGNOSIS — M5412 Radiculopathy, cervical region: Secondary | ICD-10-CM | POA: Diagnosis present

## 2020-02-23 NOTE — Therapy (Signed)
[facility] [address] [place], [place], [address] Phone: [phone]   Fax:  [phone]  Physical Therapy Evaluation  Patient Details  Name: Aaron Mejia MRN: [id number] Date of Birth: June 05, 1950 Referring Provider (PT): Dr Karl Ito    Encounter Date: 02/23/2020   PT End of Session - 02/23/20 0813    Visit Number 1    Number of Visits 12    Date for PT Re-Evaluation 04/05/20    Authorization Type MCR    PT Start Time 0800    PT Stop Time 0845    PT Time Calculation (min) 45 min    Activity Tolerance Patient tolerated treatment well    Behavior During Therapy [facility] for tasks assessed/performed           Past Medical History:  Diagnosis Date  . Anemia   . Arthritis   . GERD (gastroesophageal reflux disease)     Past Surgical History:  Procedure Laterality Date  . 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    Subjective Assessment - 02/23/20 0802    Subjective Patient has had increased pain and numbness in his neck radiating into his middle two fingers. His middle fingers can become numb. He likes to work out. He feels like reaching overhead hurts his shoulder and cuases more pain. He has trried pills which havent helped.    Pertinent History arthritis ( hip)    How long can you sit comfortably? N/A    How long can you stand comfortably? N/A    How long can you walk comfortably? N/A    Diagnostic tests X-ray of the throacic spine: degenerative changes    Patient Stated Goals to have less pain    Currently in Pain? Yes    Pain Score 9    Pain can be a 10/10   Pain Location Shoulder    Pain Orientation Left    Pain Descriptors / Indicators Aching    Pain Type Chronic pain    Pain Onset More than a month ago    Pain Frequency Constant      Aggravating Factors  use of the arm; reaching overhead    Pain Relieving Factors putting his arm over the head    Effect of Pain on Daily Activities diffucty using his right arm              [facility] PT Assessment - 02/23/20 0001      Assessment   Medical Diagnosis Right Radiculopathy and Muscle Spasm     Referring Provider (PT) Dr Karl Ito     Onset Date/Surgical Date --   8 months    Hand Dominance Right    Next MD Visit Scheduled with a neurologist     Prior Therapy None       Precautions   Precautions None      Restrictions   Weight Bearing Restrictions No      Balance Screen   Has the patient fallen in the past 6 months No    Has the patient had a decrease in activity level because of a fear of falling?  No    Is the patient reluctant to leave their home because of a fear of falling?  No      Home Environment   Additional Comments  nothing significant       Prior Function   Level of Independence Independent    Vocation Retired    Leisure likes to work out       Copy Status Within [employer] for tasks assessed    Attention Focused    Focused Attention Appears intact    Memory Appears intact    Awareness Appears intact    Problem Solving Appears intact      Observation/Other Assessments   Observations has to freqently put his hand overhis head       Sensation   Light Touch Appears Intact    Additional Comments numbness into his middle and ring finger ; pain down his arm       Coordination   Gross Motor Movements are Fluid and Coordinated Yes    Fine Motor Movements are Fluid and Coordinated Yes      ROM / Strength   AROM / PROM / Strength AROM;PROM;Strength      AROM   Overall AROM Comments painfull with end rnage shoulder flexion, abduction, and er     AROM Assessment Site Shoulder;Cervical    Cervical Flexion painful     Cervical - Right Rotation 60    Cervical - Left Rotation 62      Strength   Strength  Assessment Site Shoulder;Hand    Right/Left Shoulder Right    Right Shoulder Flexion 4/5    Right Shoulder ABduction 3+/5    Right Shoulder Internal Rotation 3+/5    Right Shoulder External Rotation 3+/5    Right/Left hand Left;Right    Right Hand Grip (lbs) 40    Left Hand Grip (lbs) 100      Palpation   Palpation comment significant spasming in right upper trap; rhomboid                       Objective measurements completed on examination: See above findings.       [facility] Adult PT Treatment/Exercise - 02/23/20 0001      Neck Exercises: Stretches   Upper Trapezius Stretch 3 reps;20 seconds    Levator Stretch 3 reps;20 seconds    Other Neck Stretches rhombioid 2x20sec     Other Neck Stretches tennis ball trigger to rhomboids, sub scap, and upper trap                   PT Education - 02/23/20 0823    Person(s) Educated Patient    Methods Demonstration;Tactile cues;Explanation    Comprehension Verbalized understanding;Returned demonstration;Verbal cues required;Tactile cues required            PT Short Term Goals - 02/23/20 1306      PT SHORT TERM GOAL #1   Title Patient will perfrom base exercises with good technique    Time 3    Period Weeks    Status New    Target Date 03/15/20      PT SHORT TERM GOAL #2   Title Patient will report a 50% reduction in radicular symptoms    Time 3    Period Weeks    Status New    Target Date 03/15/20      PT SHORT TERM GOAL #3   Title Patient will increase right grip strength to 20 lbs    Time 3    Period Weeks    Status New    Target Date 03/15/20  PT Long Term Goals - 02/23/20 1307      PT LONG TERM GOAL #1   Title Patiewnt will return to the exercise program of his choosing without pain with a good understanding of exercises that may cause him issues    Time 6    Period Weeks    Status New    Target Date 04/05/20      PT LONG TERM GOAL #2   Title Patient will use right arm  for ADL's without pain    Time 6    Period Weeks    Status New    Target Date 04/05/20      PT LONG TERM GOAL #3   Title Patient will increase right girp strength to 80lbs in order to perfrom tasks around the house    Time 6    Period Weeks    Status New    Target Date 04/05/20                  Plan - 02/23/20 0820    Clinical Impression Statement Patient is a 69 year old male with right cervical radiculopathy into his middle two fingers. He has signficant spasming in his upper trap, right Rhombhoid, and right sub scap. He is doing exercises at home that may be exacerbating his symptoms inluding  full motion weighted abudction, He was advised to discontinue other exercises 2nd to potential exacerbation. He was sonmewhat resistant to stopping his current routine but agreed to give it a few weeks of working on the exercises that we advised. He has limited cervical rotation bilateral and limited pain free right shoulder flexion. SHe has a signifcant grip strength deficit on the right side. Signs and symptoms are consitent with cervical raiduclopathy. He required max vc's for stretching technique. He was strongly advised at home that if the strethces are not helping to not do them as he is likley not doing them right. He was given an HEP with videos although it is questionable if he will still do them correctly. He may benefit from trigger point dry needling, trigger point release, and functional training to improve use of right arm.    Personal Factors and Comorbidities Time since onset of injury/illness/exacerbation;Behavior Pattern    Examination-Activity Limitations Carry;Lift;Reach Overhead    Examination-Participation Restrictions Cleaning;Driving;Yard Work;Shop    Stability/Clinical Decision Making Evolving/Moderate complexity    Clinical Decision Making High   Patient requires significant cuing for technique and reasoning behind exercises and stretches   Rehab Potential Good    PT  Frequency 2x / week    PT Duration 6 weeks    PT Treatment/Interventions ADLs/Self Care Home Management;Electrical Stimulation;Cryotherapy;Iontophoresis 4mg /ml Dexamethasone;Moist Heat;Traction;Ultrasound;DME Instruction;Gait training;Stair training;Functional mobility training;Patient/family education;Manual techniques;Passive range of motion;Taping    PT Next Visit Plan reviewe HEP; give patient some of postural exercise that challegs him but does not put him in positions that are detrimental to him. Patient will require cuing for tehcnique. Review HEP. Consder scap retraction; bilateral er with band; shoulder exntension. Trigger point release to upper trap; rhomboids, and sub scap; manual traction to cervical spine;    PT Home Exercise Plan upper trap stretch; levator stretch, rhomboid stretch, tennis ball trigger point release    Consulted and Agree with Plan of Care Patient           Patient will benefit from skilled therapeutic intervention in order to improve the following deficits and impairments:  Impaired UE functional use, Increased muscle spasms, Increased fascial restricitons,  Decreased range of motion, Decreased strength, Postural dysfunction, Decreased activity tolerance, Pain, Improper body mechanics, Decreased safety awareness  Visit Diagnosis: Radiculopathy, cervical region  Chronic right shoulder pain  Cervicalgia     Problem List Patient Active Problem List   Diagnosis Date Noted  . Osteoarthritis of left hip 09/05/2016    Dessie Coma PT DPT  02/23/2020, 1:16 PM  [facility] [address] [place], [place], [address] Phone: [phone]   Fax:  [phone]  Name: Aaron Mejia MRN: [id number] Date of Birth: 03-01-1951

## 2020-03-05 ENCOUNTER — Other Ambulatory Visit: Payer: Self-pay

## 2020-03-05 ENCOUNTER — Encounter: Payer: Self-pay | Admitting: Rehabilitative and Restorative Service Providers"

## 2020-03-05 ENCOUNTER — Ambulatory Visit: Payer: Medicare Other | Admitting: Rehabilitative and Restorative Service Providers"

## 2020-03-05 DIAGNOSIS — M5412 Radiculopathy, cervical region: Secondary | ICD-10-CM

## 2020-03-05 DIAGNOSIS — M542 Cervicalgia: Secondary | ICD-10-CM

## 2020-03-05 DIAGNOSIS — G8929 Other chronic pain: Secondary | ICD-10-CM

## 2020-03-05 NOTE — Therapy (Signed)
[facility] [address] [place], [place], [address] Phone: [phone]   Fax:  [phone]  Physical Therapy Treatment  Patient Details  Name: Aaron Mejia MRN: [id number] Date of Birth: Jan 30, 1951 Referring Provider (PT): Dr Karl Ito    Encounter Date: 03/05/2020   PT End of Session - 03/05/20 1104    Visit Number 2    Number of Visits 12    Date for PT Re-Evaluation 04/05/20    Authorization Type MCR    PT Start Time 1058    PT Stop Time 1147    PT Time Calculation (min) 49 min    Activity Tolerance Patient tolerated treatment well;Patient limited by pain    Behavior During Therapy [facility] for tasks assessed/performed           Past Medical History:  Diagnosis Date  . Anemia   . Arthritis   . GERD (gastroesophageal reflux disease)     Past Surgical History:  Procedure Laterality Date  . 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   Subjective Assessment - 03/05/20 1056    Subjective I still have the pain in my arm and I have to hold it up to make it feel better. Pt declines doing pushups; said this pain is limiting his ability to walk as well.    How long can you sit comfortably? 0 minutes    Patient Stated Goals to have less pain    Currently in Pain? Yes    Pain Score 8     Pain Location Arm    Pain Orientation Right    Pain Descriptors / Indicators Aching;Numbness;Sharp;Shooting    Pain Onset More than a month ago    Aggravating Factors  letting arm hang down makes it worse    Pain Relieving Factors holding arm up makes it feel better    Multiple Pain Sites No                             [facility] Adult PT Treatment/Exercise - 03/05/20 0001      Neck Exercises: Seated   Other Seated Exercise Rhomboid stretch various  angles 2x30 sec each with increased radicular pain with pt performing R overhead abduction to relieve pain between each bout; Review HEP      Modalities   Modalities Ultrasound      Ultrasound   Ultrasound Location R Levator/Rhomboid    Ultrasound Parameters 100% 2.0w/cm2 x 8 min    Ultrasound Goals Pain                  PT Education - 03/05/20 1203    Education Details Reviewed HEP; advised pt to keep doing HEP that helps that was given at the evaluation; discussed postural importance of significance of referred pain; discussed how cervical impairment will radiate pain down UE; discussed seeing a neurologist and a nerve conduction test    Person(s) Educated Patient    Methods Explanation    Comprehension Verbalized understanding;Returned demonstration;Verbal cues required            PT Short Term Goals - 03/05/20 1204      PT SHORT TERM GOAL #1   Title Patient will perfrom base exercises with good technique    Status On-going  PT SHORT TERM GOAL #2   Title Patient will report a 50% reduction in radicular symptoms    Status On-going      PT SHORT TERM GOAL #3   Title Patient will increase right grip strength to 20 lbs    Status On-going             PT Long Term Goals - 02/23/20 1307      PT LONG TERM GOAL #1   Title Patiewnt will return to the exercise program of his choosing without pain with a good understanding of exercises that may cause him issues    Time 6    Period Weeks    Status New    Target Date 04/05/20      PT LONG TERM GOAL #2   Title Patient will use right arm for ADL's without pain    Time 6    Period Weeks    Status New    Target Date 04/05/20      PT LONG TERM GOAL #3   Title Patient will increase right girp strength to 80lbs in order to perfrom tasks around the house    Time 6    Period Weeks    Status New    Target Date 04/05/20                 Plan - 03/05/20 1128    Clinical Impression Statement Pt presents to  PT with continued R UE pain extending into 4th and 5th digits. Pt demonstrates tightness to R Levator and Rhomboid and has scapular asymmetry and winging. Pt believes pain to be coming from shoulder and not neck or tightness. Shoulder abduction with occasional cervical L lateral lean in supine decreased pain. Pt observed to have improvement in muscle tightness after [place] and manual treatments. Pt would benefit from further PT to address R shoulder pain management with continued education on posture, neck/shoulder pain if pt demos improvement. Pt reports not doing any aggravating factors at home and that he is beginning to have fibular nerve pain as well with transfers, especially in AM. PT advised him to tell MD. Pain returned to normal pain level after treatment.    Rehab Potential Good    PT Frequency 2x / week    PT Duration 6 weeks    PT Treatment/Interventions ADLs/Self Care Home Management;Electrical Stimulation;Cryotherapy;Iontophoresis 4mg /ml Dexamethasone;Moist Heat;Traction;Ultrasound;DME Instruction;Gait training;Stair training;Functional mobility training;Patient/family education;Manual techniques;Passive range of motion;Taping    PT Next Visit Plan give patient some of postural exercise that challenges him but does not put him in positions that are detrimental to him. Patient will require cueing for technique. Consider scap retraction; bilateral er with band; shoulder ext as previously stated. Trigger point release to upper trap, rhomboids, and sub scap    Consulted and Agree with Plan of Care Patient           Patient will benefit from skilled therapeutic intervention in order to improve the following deficits and impairments:  Impaired UE functional use, Increased muscle spasms, Increased fascial restricitons, Decreased range of motion, Decreased strength, Postural dysfunction, Decreased activity tolerance, Pain, Improper body mechanics, Decreased safety awareness  Visit  Diagnosis: Radiculopathy, cervical region  Chronic right shoulder pain  Cervicalgia     Problem List Patient Active Problem List   Diagnosis Date Noted  . Osteoarthritis of left hip 09/05/2016    09/07/2016, PT 03/05/2020, 12:08 PM  [facility] [address] [place], [place], [place] Phone:  [phone]   Fax:  [phone]  Name: TREYDON HENRICKS MRN: [id number] Date of Birth: [birth year]

## 2020-03-15 ENCOUNTER — Ambulatory Visit: Payer: Medicare Other | Attending: General Practice | Admitting: Physical Therapy

## 2020-03-15 ENCOUNTER — Other Ambulatory Visit: Payer: Self-pay

## 2020-03-15 ENCOUNTER — Encounter: Payer: Self-pay | Admitting: Physical Therapy

## 2020-03-15 DIAGNOSIS — G8929 Other chronic pain: Secondary | ICD-10-CM | POA: Diagnosis present

## 2020-03-15 DIAGNOSIS — M542 Cervicalgia: Secondary | ICD-10-CM | POA: Insufficient documentation

## 2020-03-15 DIAGNOSIS — M5412 Radiculopathy, cervical region: Secondary | ICD-10-CM | POA: Diagnosis not present

## 2020-03-15 DIAGNOSIS — M25511 Pain in right shoulder: Secondary | ICD-10-CM | POA: Diagnosis present

## 2020-03-15 NOTE — Therapy (Signed)
[facility] Outpatient Rehabilitation [facility] [address] [place], [place], [address] Phone: [phone]   Fax:  [phone]  Physical Therapy Treatment  Patient Details  Name: Aaron Mejia MRN: [id number] Date of Birth: March 14, 1951 Referring Provider (PT): Dr Karl Ito    Encounter Date: 03/15/2020   PT End of Session - 03/15/20 0902    Visit Number 3    Number of Visits 12    Date for PT Re-Evaluation 04/05/20    Authorization Type MCR    PT Start Time 0800    PT Stop Time 0845    PT Time Calculation (min) 45 min    Activity Tolerance Patient tolerated treatment well;Patient limited by pain    Behavior During Therapy [facility] for tasks assessed/performed           Past Medical History:  Diagnosis Date  . Anemia   . Arthritis   . GERD (gastroesophageal reflux disease)     Past Surgical History:  Procedure Laterality Date  . 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   Subjective Assessment - 03/15/20 0758    Subjective "When I went to feed my dog I turned around and my foot turned out with a lot of pain and numbness going down into my foot. My arm/shoulder still hurts and I am doing the exercises but the pain is behind the shoulder and when my shoulder goes back the ball rolls out. I called my doctor due to the leg pain and I havent heard back about it."    Currently in Pain? Yes    Pain Score 8     Pain Location Shoulder    Pain Orientation Right    Pain Descriptors / Indicators Shooting    Pain Radiating Towards right hand, ulnar nerve distribution    Pain Onset More than a month ago    Pain Frequency Constant    Aggravating Factors  Hand down by side    Pain Relieving Factors hand on top of head                             [facility] Adult PT  Treatment/Exercise - 03/15/20 0001      Manual Therapy   Manual Therapy Soft tissue mobilization;Joint mobilization;Manual Traction;Neural Stretch    Manual therapy comments Suboccipital release, STW to cervical paraspinas    Joint Mobilization PA Grade I & II C3-C7 (patient noted CC pain at C3-C4); lateral gapping of the right cervical vertebrae (no change); right 1st rib mobilization     Soft tissue mobilization UT TP release bilaterally; manual TP release on scalenes bilaterally     Manual Traction Gentle distraction for 5 minutes    Neural Stretch Ulnar nerve glides, pt unable to perform himself so PT provided movement from wrist only   Maximal TC and VC     Neck Exercises: Stretches   Upper Trapezius Stretch 3 reps;20 seconds;Right    Levator Stretch Right;3 reps;20 seconds                  PT Education - 03/15/20 0859    Education Details Anatomy of cervical spine and musculature and radiculopathy symptoms, importance of stretching the neck, discussion of trialing mechanical traction    Person(s) Educated Patient  Methods Explanation    Comprehension Verbalized understanding            PT Short Term Goals - 03/05/20 1204      PT SHORT TERM GOAL #1   Title Patient will perfrom base exercises with good technique    Status On-going      PT SHORT TERM GOAL #2   Title Patient will report a 50% reduction in radicular symptoms    Status On-going      PT SHORT TERM GOAL #3   Title Patient will increase right grip strength to 20 lbs    Status On-going             PT Long Term Goals - 02/23/20 1307      PT LONG TERM GOAL #1   Title Patiewnt will return to the exercise program of his choosing without pain with a good understanding of exercises that may cause him issues    Time 6    Period Weeks    Status New    Target Date 04/05/20      PT LONG TERM GOAL #2   Title Patient will use right arm for ADL's without pain    Time 6    Period Weeks    Status New     Target Date 04/05/20      PT LONG TERM GOAL #3   Title Patient will increase right girp strength to 80lbs in order to perfrom tasks around the house    Time 6    Period Weeks    Status New    Target Date 04/05/20                 Plan - 03/15/20 0836    Clinical Impression Statement Pt presents to PT with Bakody's sign of abduction with hand on head; reports "this is the only position that reduces pain," suggestive of cervical radiculopathy. Manual traction was not well tolerated with moderate gaurding and nonspecific relief but possibly less intense into the right hand. Ulnar nerve gliding with maximal TC and VC did report some lessining of nerve symptoms and patient was given HEP of gliding ulnar nerve  without cervical movements. Pt reported twisting his left hip one week ago with pain down the leg and was recommended to follow up with PCP.    Rehab Potential Good    PT Frequency 2x / week    PT Duration 6 weeks    PT Treatment/Interventions ADLs/Self Care Home Management;Electrical Stimulation;Cryotherapy;Iontophoresis 4mg /ml Dexamethasone;Moist Heat;Traction;Ultrasound;DME Instruction;Gait training;Stair training;Functional mobility training;Patient/family education;Manual techniques;Passive range of motion;Taping    PT Next Visit Plan F/U ulnar nerve glides; give patient some of postural exercise that challenges him but does not put him in positions that are detrimental to him. Patient will require cueing for technique. Consider scap retraction; bilateral er with band; shoulder ext as previously stated. Trigger point release to upper trap, rhomboids, and sub scap    PT Home Exercise Plan [id number] -- UT and LS stretch focus right, ulnar nerve glide right    Recommended Other Services Pt recommended to call PCP for recent left hip pain and results of previous x-ray    Consulted and Agree with Plan of Care Patient           Patient will benefit from skilled therapeutic  intervention in order to improve the following deficits and impairments:  Impaired UE functional use, Increased muscle spasms, Increased fascial restricitons, Decreased range of motion, Decreased strength, Postural dysfunction, Decreased  activity tolerance, Pain, Improper body mechanics, Decreased safety awareness  Visit Diagnosis: Radiculopathy, cervical region  Chronic right shoulder pain  Cervicalgia     Problem List Patient Active Problem List   Diagnosis Date Noted  . Osteoarthritis of left hip 09/05/2016    Johnn Hai, SPT 03/15/2020, 9:10 AM  [facility] [address] [place], [place], [address] Phone: [phone]   Fax:  [phone]  Name: BARDIA WANGERIN MRN: [id number] Date of Birth: 05/28/1950

## 2020-03-22 ENCOUNTER — Ambulatory Visit: Payer: Medicare Other | Admitting: Physical Therapy

## 2020-03-22 ENCOUNTER — Other Ambulatory Visit: Payer: Self-pay

## 2020-03-22 ENCOUNTER — Encounter: Payer: Self-pay | Admitting: Physical Therapy

## 2020-03-22 DIAGNOSIS — G8929 Other chronic pain: Secondary | ICD-10-CM

## 2020-03-22 DIAGNOSIS — M5412 Radiculopathy, cervical region: Secondary | ICD-10-CM

## 2020-03-22 DIAGNOSIS — M542 Cervicalgia: Secondary | ICD-10-CM

## 2020-03-22 NOTE — Therapy (Signed)
[facility] Outpatient Rehabilitation [facility] [address] [place], [place], [address] Phone: [phone]   Fax:  [phone]  Physical Therapy Treatment  Patient Details  Name: Aaron Mejia MRN: [id number] Date of Birth: 07-Oct-1950 Referring Provider (PT): Dr Karl Ito    Encounter Date: 03/22/2020   PT End of Session - 03/22/20 0801    Visit Number 4    Number of Visits 12    Date for PT Re-Evaluation 04/05/20    Authorization Type MCR    PT Start Time 0800    PT Stop Time 0849    PT Time Calculation (min) 49 min    Activity Tolerance Patient tolerated treatment well;Patient limited by pain    Behavior During Therapy [facility] for tasks assessed/performed           Past Medical History:  Diagnosis Date  . Anemia   . Arthritis   . GERD (gastroesophageal reflux disease)     Past Surgical History:  Procedure Laterality Date  . 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   Subjective Assessment - 03/22/20 0801    Subjective "I went back to the MD and they gave me 2 shots in the back for my legs which helped the back, still some issue down the L leg. the nerve glide exercise is helping"    Currently in Pain? Yes    Pain Score 5     Pain Location Neck    Pain Orientation Right    Pain Type Chronic pain    Pain Frequency Intermittent    Aggravating Factors  down the R arm                             [facility] Adult PT Treatment/Exercise - 03/22/20 0815      Neck Exercises: Seated   Other Seated Exercise snags 1 x 10 holidng each position 5 seconds performed bil   verbal cues for proper hand placement     Neck Exercises: Supine   Neck Retraction 10 reps;5 secs   cues for proper form   Capital Flexion 10 reps;5 secs   combined with chin tuck / head lift      Modalities   Modalities Traction      Traction   Type of Traction Cervical    Min (lbs) 10    Max (lbs) 16    Hold Time 60    Rest Time 20    Time 10      Manual Therapy   Manual therapy comments sub-occipital release, and MTRP along upper trap/ levator scapuale on the R    Joint Mobilization R lateral gapping C4-C7 grade III    Neural Stretch Ulnar nerve glides in sitting 1 set      Neck Exercises: Stretches   Upper Trapezius Stretch 1 rep;Right;30 seconds    Levator Stretch 1 rep;Right;30 seconds                    PT Short Term Goals - 03/05/20 1204      PT SHORT TERM GOAL #1   Title Patient will perfrom base exercises with good technique    Status On-going      PT SHORT TERM GOAL #2   Title Patient will report a  50% reduction in radicular symptoms    Status On-going      PT SHORT TERM GOAL #3   Title Patient will increase right grip strength to 20 lbs    Status On-going             PT Long Term Goals - 02/23/20 1307      PT LONG TERM GOAL #1   Title Patiewnt will return to the exercise program of his choosing without pain with a good understanding of exercises that may cause him issues    Time 6    Period Weeks    Status New    Target Date 04/05/20      PT LONG TERM GOAL #2   Title Patient will use right arm for ADL's without pain    Time 6    Period Weeks    Status New    Target Date 04/05/20      PT LONG TERM GOAL #3   Title Patient will increase right girp strength to 80lbs in order to perfrom tasks around the house    Time 6    Period Weeks    Status New    Target Date 04/05/20                 Plan - 03/22/20 2725    Clinical Impression Statement pt reports improvement in his RUE with reduced referral into his 4th and 5th digits. Continued STW along the upper trap, sub-occipitals and levator scapulae followed with R lateral gapping mobs. pt responded well with therex requiring min cues for proper hand placement with snags.  Updated HEP today to promote efficient posture. trialed mechanical traction end of session which he noted continued relief of tension in the neck/ RUE.    PT Treatment/Interventions ADLs/Self Care Home Management;Electrical Stimulation;Cryotherapy;Iontophoresis 4mg /ml Dexamethasone;Moist Heat;Traction;Ultrasound;DME Instruction;Gait training;Stair training;Functional mobility training;Patient/family education;Manual techniques;Passive range of motion;Taping    PT Next Visit Plan if tolerated well continue traction increasing pull. . Patient will require cueing for technique.  Trigger point release to upper trap, rhomboids, and sub scap,    PT Home Exercise Plan [id number] -- UT and LS stretch focus right, ulnar nerve glide right, SNAGs, scapular retraction, seated chin tuck.           Patient will benefit from skilled therapeutic intervention in order to improve the following deficits and impairments:  Impaired UE functional use,Increased muscle spasms,Increased fascial restricitons,Decreased range of motion,Decreased strength,Postural dysfunction,Decreased activity tolerance,Pain,Improper body mechanics,Decreased safety awareness  Visit Diagnosis: Radiculopathy, cervical region  Chronic right shoulder pain  Cervicalgia     Problem List Patient Active Problem List   Diagnosis Date Noted  . Osteoarthritis of left hip 09/05/2016   09/07/2016 PT, DPT, LAT, ATC  03/22/20  9:02 AM      [facility] [address] [place], [place], [place] Phone: [phone]   Fax:  [phone]  Name: Aaron Mejia MRN: Levora Angel Date of Birth: 1950/09/07

## 2020-03-29 ENCOUNTER — Other Ambulatory Visit: Payer: Self-pay

## 2020-03-29 ENCOUNTER — Encounter: Payer: Self-pay | Admitting: Physical Therapy

## 2020-03-29 ENCOUNTER — Ambulatory Visit: Payer: Medicare Other | Admitting: Physical Therapy

## 2020-03-29 DIAGNOSIS — M5412 Radiculopathy, cervical region: Secondary | ICD-10-CM | POA: Diagnosis not present

## 2020-03-29 DIAGNOSIS — M542 Cervicalgia: Secondary | ICD-10-CM

## 2020-03-29 DIAGNOSIS — G8929 Other chronic pain: Secondary | ICD-10-CM

## 2020-03-29 NOTE — Therapy (Signed)
[place], [place], [address] Phone: [phone]   Fax:  [phone]  Physical Therapy Treatment / Discharge  Patient Details  Name: Aaron Mejia MRN: [id number] Date of Birth: 1951/02/03 Referring Provider (PT): Dr Andree Moro    Encounter Date: 03/29/2020   PT End of Session - 03/29/20 0815    Visit Number 5    Number of Visits 12    Date for PT Re-Evaluation 04/05/20    Authorization Type MCR    PT Start Time 0800    PT Stop Time 0840    PT Time Calculation (min) 40 min    Activity Tolerance Patient tolerated treatment well;Patient limited by pain    Behavior During Therapy [facility] for tasks assessed/performed           Past Medical History:  Diagnosis Date  . Anemia   . Arthritis   . GERD (gastroesophageal reflux disease)     Past Surgical History:  Procedure Laterality Date  . 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   Subjective Assessment - 03/29/20 0805    Subjective " I am doing better, but the morning still gives me issues. The only thing that seems to calm it down in the morning is my pain medication. the traction last time helped for a little bit but the pain seemed to come right back."    Patient Stated Goals to have less pain    Currently in Pain? Yes    Pain Score 0-No pain   last took medication for pain at 6am   Pain Orientation Right              [facility] PT Assessment - 03/29/20 0001      Assessment   Medical Diagnosis Right Radiculopathy and Muscle Spasm     Referring Provider (PT) Dr Andree Moro       Observation/Other Assessments   Focus on Therapeutic Outcomes (FOTO)  41% limited      AROM   Cervical Flexion 15    Cervical Extension 60   noted dizziness and mild reproduction of concordant  symptoms   Cervical - Right Side Bend 28    Cervical - Left Side Bend 50    Cervical - Right Rotation 65   reproduced concordant shoulder stiffness/ pain   Cervical - Left Rotation 61      Strength   Right Hand Grip (lbs) 45   57,45,35                        [facility] Adult PT Treatment/Exercise - 03/29/20 0001      Neck Exercises: Seated   Neck Retraction 10 reps;5 secs    Other Seated Exercise Ulnar nerve glide RUE only 1 x 10 holding 5 seconds    Other Seated Exercise snags 1 x 10 holidng each position 5 seconds performed to the L only   scapular retraction 1 x 10 holding 3-5 seconds cues to avoid hiking the shoulder up.     Neck Exercises: Stretches   Upper Trapezius Stretch Right;30 seconds;1 rep    Levator Stretch 1 rep;Right;30 seconds                  PT Education - 03/29/20 0841    Education  Details Reviewed HEP and discussed appropriate progression of exercises and benefits of consistency with his exercise. reviewed assessment compared to inital evaluation.    Person(s) Educated Patient    Methods Explanation;Verbal cues    Comprehension Verbalized understanding;Verbal cues required            PT Short Term Goals - 03/29/20 0809      PT SHORT TERM GOAL #1   Title Patient will perfrom base exercises with good technique    Period Weeks    Status Achieved      PT SHORT TERM GOAL #2   Title Patient will report a 50% reduction in radicular symptoms    Period Weeks    Status Not Met      PT SHORT TERM GOAL #3   Title Patient will increase right grip strength to 20 lbs    Period Weeks    Status Not Met             PT Long Term Goals - 03/29/20 0813      PT LONG TERM GOAL #1   Title Patiewnt will return to the exercise program of his choosing without pain with a good understanding of exercises that may cause him issues    Status Partially Met      PT LONG TERM GOAL #2   Title Patient will use right arm for ADL's without pain     Period Weeks    Status Not Met      PT LONG TERM GOAL #3   Title Patient will increase right girp strength to 80lbs in order to perfrom tasks around the house    Period Weeks    Status Not Met                 Plan - 03/29/20 0817    Clinical Impression Statement today marks the patients 5th visit and per his report he notes his biggest issue is getting out of bed in the morning and only relief that he gets is via pain medication despite doing his HEP. He reports continued R shoulder and referred RUE N/T into the ulnar distribution that worsens with actiivty and is relieved with placing his R hand on his head that is indicative of cervical involvment (Bakody's sign). pt has made limited progress toward his STG and LTGs noting continued issues, with only relief being his pain mediation. He also scored 9 poins less on his FOTO assessment indicating increased functional limitation.  will dishcarged from PT todaybased on lmited functional progress, continued neck / shoulder pain as well as RUE referral symptoms into the ulnar distribution and additionaly reported bil LE referred issues when doing neck / shoulder exercises. pt would benefit from further assessment by an orthopedic/ spine MD.    [place] -- UT and LS stretch focus right, ulnar nerve glide right, SNAGs, scapular retraction, seated chin tuck.           Patient will benefit from skilled therapeutic intervention in order to improve the following deficits and impairments:  Impaired UE functional use,Increased muscle spasms,Increased fascial restricitons,Decreased range of motion,Decreased strength,Postural dysfunction,Decreased activity tolerance,Pain,Improper body mechanics,Decreased safety awareness  Visit Diagnosis: Radiculopathy, cervical region  Chronic right shoulder pain  Cervicalgia     Problem List Patient Active Problem List   Diagnosis Date Noted  . Osteoarthritis of left hip 09/05/2016     Starr [place] 03/29/2020, 8:43 AM  [place]  [place], [place], [address] Phone: [phone]   Fax:  [phone]  Name: Aaron Mejia MRN: [id number] Date of Birth: 1951/02/08      PHYSICAL THERAPY DISCHARGE SUMMARY  Visits from Start of Care: 5  Current functional level related to goals / functional outcomes: See goals, FOTO 41% limited   Remaining deficits: See assessment   Education / Equipment: HEP, theraband, posture, anatomy of areas involved.   Plan: Patient agrees to discharge.  Patient goals were not met. Patient is being discharged due to lack of progress.  ?????         Wana Mount PT, DPT, LAT, ATC  03/29/20  8:44 AM

## 2020-04-20 ENCOUNTER — Ambulatory Visit: Payer: Medicare Other | Admitting: Neurology

## 2021-03-06 ENCOUNTER — Ambulatory Visit: Payer: Medicare Other | Admitting: Nurse Practitioner

## 2021-03-19 ENCOUNTER — Ambulatory Visit (INDEPENDENT_AMBULATORY_CARE_PROVIDER_SITE_OTHER): Payer: Medicare Other | Admitting: Nurse Practitioner

## 2021-03-19 ENCOUNTER — Other Ambulatory Visit: Payer: Self-pay

## 2021-03-19 ENCOUNTER — Encounter: Payer: Self-pay | Admitting: Nurse Practitioner

## 2021-03-19 VITALS — BP 130/78 | HR 68 | Temp 98.0°F | Ht 65.4 in | Wt 137.6 lb

## 2021-03-19 DIAGNOSIS — R202 Paresthesia of skin: Secondary | ICD-10-CM

## 2021-03-19 DIAGNOSIS — Z96642 Presence of left artificial hip joint: Secondary | ICD-10-CM

## 2021-03-19 DIAGNOSIS — Z7689 Persons encountering health services in other specified circumstances: Secondary | ICD-10-CM

## 2021-03-19 DIAGNOSIS — E559 Vitamin D deficiency, unspecified: Secondary | ICD-10-CM

## 2021-03-19 DIAGNOSIS — R350 Frequency of micturition: Secondary | ICD-10-CM

## 2021-03-19 DIAGNOSIS — R200 Anesthesia of skin: Secondary | ICD-10-CM

## 2021-03-19 MED ORDER — TAMSULOSIN HCL 0.4 MG PO CAPS
0.4000 mg | ORAL_CAPSULE | Freq: Every day | ORAL | 2 refills | Status: DC
Start: 2021-03-19 — End: 2021-06-11

## 2021-03-19 NOTE — Progress Notes (Signed)
I,Aaron Mejia,acting as a Education administrator for [employer], NP.,have documented all relevant documentation on the behalf of [employer], NP,as directed by  Aaron Castilla, NP while in the presence of Aaron Castilla, NP.  This visit occurred during the SARS-CoV-2 public health emergency.  Safety protocols were in place, including screening questions prior to the visit, additional usage of staff PPE, and extensive cleaning of exam room while observing appropriate contact time as indicated for disinfecting solutions.  Subjective:     Patient ID: Aaron Mejia , male    DOB: [birth year] , 70 y.o.   MRN: [id number]   Chief Complaint  Patient presents with   Establish Care   HPI  Patient is here to establish care. He was with Dr. Criss Mejia. He would like to discuss numbness in his left arm when he sleeps.   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 He takes Flomax  Diet: he tries to eat healthy  Exercise: he does. He walks and does stretches and does arm exercises.  Drink or Smoke: He does not smoke cigarettes. He drinks occassionally. He smokes mariajuana everyday.  Dentist: he hasn't been in a very long time.  He will come back in 3 months for physical exam.     Past Medical History:  Diagnosis Date   Anemia    Arthritis    GERD (gastroesophageal reflux disease)      Family History  Problem Relation Age of Onset   Hypertension Mother    Hypertension Father    Kidney disease Father      Current Outpatient Medications:    tamsulosin (FLOMAX) 0.4 MG CAPS capsule, Take 1 capsule (0.4 mg total) by mouth daily., Disp: 30 capsule, Rfl: 2   Allergies  Allergen Reactions   Penicillins Swelling    Has never taken it was just told it makes him swell  Has patient had a PCN reaction causing immediate rash, facial/tongue/throat swelling, SOB or lightheadedness with hypotension: unknown Has patient had a PCN reaction causing severe rash involving mucus membranes or skin  necrosis: unknown Has patient had a PCN reaction that required hospitalization unknown Has patient had a PCN reaction occurring within the last 10 years: No If all of the above answers are "NO", then may proceed with Cephalosporin     Review of Systems  Constitutional: Negative.   Respiratory: Negative.  Negative for cough, chest tightness and wheezing.   Cardiovascular: Negative.  Negative for chest pain and palpitations.  Gastrointestinal: Negative.  Negative for constipation.  Genitourinary:  Positive for frequency.  Musculoskeletal:  Negative for arthralgias and myalgias.  Neurological:  Positive for numbness. Negative for weakness and headaches.       Right hand.     Today's Vitals   03/19/21 1411  BP: 130/78  Pulse: 68  Temp: 98 F (36.7 C)  TempSrc: Oral  Weight: 137 lb 9.6 oz (62.4 kg)  Height: 5' 5.4" (1.661 m)   Body mass index is 22.62 kg/m.  Wt Readings from Last 3 Encounters:  03/19/21 137 lb 9.6 oz (62.4 kg)  09/05/16 145 lb (65.8 kg)  08/25/16 145 lb 12.8 oz (66.1 kg)    Objective:  Physical Exam Constitutional:      Appearance: Normal appearance.  HENT:     Head: Normocephalic and atraumatic.  Cardiovascular:     Rate and Rhythm: Normal rate and regular rhythm.     Pulses: Normal pulses.     Heart sounds: Normal heart sounds.  Pulmonary:     Effort: Pulmonary effort is normal. No respiratory distress.     Breath sounds: Normal breath sounds. No wheezing.  Musculoskeletal:     Comments: Some numbness and tingling in his right hand.   Skin:    General: Skin is warm and dry.     Capillary Refill: Capillary refill takes less than 2 seconds.  Neurological:     Mental Status: He is alert and oriented to person, place, and time.        Assessment And Plan:     1. Establishing care with new doctor, encounter for --Patient is here to establish care. Aaron Mejia over patient medical, family, social and surgical history. -Reviewed with patient their  medications and any allergies  -Reviewed with patient their sexual orientation, drug/tobacco and alcohol use -Dicussed any new concerns with patient  -recommended patient comes in for a physical exam and complete blood work.  -Educated patient about the importance of annual screenings and immunizations.  -Advised patient to eat a healthy diet along with exercise for atleast 30-45 min atleast 4-5 days of the week.  -Last time he got blood work was 8 months ago.  -He will comeback for a physical in 3 months.   2. Numbness and tingling - Hemoglobin A1c - CMP14+EGFR - Magnesium - CBC no Diff  3. Vitamin D deficiency -Will check and supplement if needed. Advised patient to spend atleast 15 min. Daily in sunlight.  - Vitamin D (25 hydroxy)  4. Urinary frequency -Will refill from his previous provider. He gets up 3-4 times at night to urinate.  - tamsulosin (FLOMAX) 0.4 MG CAPS capsule; Take 1 capsule (0.4 mg total) by mouth daily.  Dispense: 30 capsule; Refill: 2   The patient was encouraged to call or send a message through [place] for any questions or concerns.   Follow up: if symptoms persist or do not get better.   Side effects and appropriate use of all the medication(s) were discussed with the patient today. Patient advised to use the medication(s) as directed by their healthcare provider. The patient was encouraged to read, review, and understand all associated package inserts and contact our office with any questions or concerns. The patient accepts the risks of the treatment plan and had an opportunity to ask questions.   Patient was given opportunity to ask questions. Patient verbalized understanding of the plan and was able to repeat key elements of the plan. All questions were answered to their satisfaction.  Aaron Abiha Lukehart, DNP   I, Aaron Mejia have reviewed all documentation for this visit. The documentation on 03/19/21 for the exam, diagnosis, procedures, and orders are all  accurate and complete.    IF YOU HAVE BEEN REFERRED TO A SPECIALIST, IT MAY TAKE 1-2 WEEKS TO SCHEDULE/PROCESS THE REFERRAL. IF YOU HAVE NOT HEARD FROM US/SPECIALIST IN TWO WEEKS, PLEASE GIVE [place] A CALL AT [phone].   THE PATIENT IS ENCOURAGED TO PRACTICE SOCIAL DISTANCING DUE TO THE COVID-19 PANDEMIC.

## 2021-03-19 NOTE — Patient Instructions (Signed)
Paresthesia ?Paresthesia is a burning or prickling feeling. This feeling can happen in any part of the body. It often happens in the hands, arms, legs, or feet. Usually, it is not painful. In most cases, the feeling goes away in a short time and is not a sign of a serious problem. If you have paresthesia that lasts a long time, you need to see your doctor. ?Follow these instructions at home: ?Nutrition ?Eat a healthy diet. This includes: ?Eating foods that are high in fiber. These include beans, whole grains, and fresh fruits and vegetables. ?Limiting foods that are high in fat and sugar. These include fried or sweet foods. ? ?Alcohol use ? ?Do not drink alcohol if: ?Your doctor tells you not to drink. ?You are pregnant, may be pregnant, or are planning to become pregnant. ?If you drink alcohol: ?Limit how much you have to: ?0-1 drink a day for women. ?0-2 drinks a day for men. ?Know how much alcohol is in your drink. In the [place] one drink equals one 12 oz bottle of beer (355 mL), one 5 oz glass of wine (148 mL), or one 1? oz glass of hard liquor (44 mL). ?General instructions ?Take over-the-counter and prescription medicines only as told by your doctor. ?Do not smoke or use any products that contain nicotine or tobacco. If you need help quitting, ask your doctor. ?If you have diabetes, work with your doctor to make sure your blood sugar stays in a healthy range. ?If your feet feel numb: ?Check for redness, warmth, and swelling every day. ?Wear padded socks and comfortable shoes. These help protect your feet. ?Keep all follow-up visits. ?Contact a doctor if: ?You have paresthesia that gets worse or does not go away. ?You lose feeling (have numbness) after an injury. ?Your burning or prickling feeling gets worse when you walk. ?You have pain or cramps. ?You feel dizzy or you faint. ?You have a rash. ?Get help right away if: ?You feel weak or have new weakness in an arm or leg. ?You have trouble walking or  moving. ?You have problems speaking, understanding, or seeing. ?You feel confused. ?You cannot control when you pee (urinate) or poop (have a bowel movement). ?These symptoms may be an emergency. Get help right away. Call 911. ?Do not wait to see if the symptoms will go away. ?Do not drive yourself to the hospital. ?Summary ?Paresthesia is a burning or prickling feeling. It often happens in the hands, arms, legs, or feet. ?In most cases, the feeling goes away in a short time and is not a sign of a serious problem. ?If you have paresthesia that lasts a long time, you need to be seen by your doctor. ?This information is not intended to replace advice given to you by your health care provider. Make sure you discuss any questions you have with your health care provider. ?Document Revised: 12/10/2020 Document Reviewed: 12/10/2020 ?Elsevier Patient Education ? 2022 Elsevier Inc[employer] ? ?

## 2021-03-20 ENCOUNTER — Other Ambulatory Visit: Payer: Self-pay | Admitting: Nurse Practitioner

## 2021-03-20 LAB — CMP14+EGFR
ALT: 14 IU/L (ref 0–44)
AST: 25 IU/L (ref 0–40)
Albumin/Globulin Ratio: 1.8 (ref 1.2–2.2)
Albumin: 4.5 g/dL (ref 3.8–4.8)
Alkaline Phosphatase: 82 IU/L (ref 44–121)
BUN/Creatinine Ratio: 11 (ref 10–24)
BUN: 13 mg/dL (ref 8–27)
Bilirubin Total: <0.2 mg/dL (ref 0.0–1.2)
CO2: 25 mmol/L (ref 20–29)
Calcium: 9.7 mg/dL (ref 8.6–10.2)
Chloride: 103 mmol/L (ref 96–106)
Creatinine, Ser: 1.14 mg/dL (ref 0.76–1.27)
Globulin, Total: 2.5 g/dL (ref 1.5–4.5)
Glucose: 88 mg/dL (ref 70–99)
Potassium: 5.2 mmol/L (ref 3.5–5.2)
Sodium: 142 mmol/L (ref 134–144)
Total Protein: 7 g/dL (ref 6.0–8.5)
eGFR: 70 mL/min/{1.73_m2} (ref >59)

## 2021-03-20 LAB — CBC
Hematocrit: 37.8 % (ref 37.5–51.0)
Hemoglobin: 12.1 g/dL — ABNORMAL LOW (ref 13.0–17.7)
MCH: 26.7 pg (ref 26.6–33.0)
MCHC: 32 g/dL (ref 31.5–35.7)
MCV: 83 fL (ref 79–97)
Platelets: 294 10*3/uL (ref 150–450)
RBC: 4.54 x10E6/uL (ref 4.14–5.80)
RDW: 13.9 % (ref 11.6–15.4)
WBC: 5.8 10*3/uL (ref 3.4–10.8)

## 2021-03-20 LAB — MAGNESIUM: Magnesium: 2.4 mg/dL — ABNORMAL HIGH (ref 1.6–2.3)

## 2021-03-20 LAB — HEMOGLOBIN A1C
Est. average glucose Bld gHb Est-mCnc: 114 mg/dL
Hgb A1c MFr Bld: 5.6 % (ref 4.8–5.6)

## 2021-03-20 LAB — VITAMIN D 25 HYDROXY (VIT D DEFICIENCY, FRACTURES): Vit D, 25-Hydroxy: 18.6 ng/mL — ABNORMAL LOW (ref 30.0–100.0)

## 2021-03-20 MED ORDER — VITAMIN D (ERGOCALCIFEROL) 1.25 MG (50000 UNIT) PO CAPS: 50000.0000 [IU] | ORAL_CAPSULE | ORAL | 1 refills | Status: DC

## 2021-03-28 ENCOUNTER — Other Ambulatory Visit: Payer: Self-pay

## 2021-03-28 ENCOUNTER — Ambulatory Visit (INDEPENDENT_AMBULATORY_CARE_PROVIDER_SITE_OTHER): Payer: Medicare Other

## 2021-03-28 VITALS — BP 138/66 | HR 58 | Temp 97.9°F | Ht 65.0 in | Wt 137.2 lb

## 2021-03-28 DIAGNOSIS — Z0000 Encounter for general adult medical examination without abnormal findings: Secondary | ICD-10-CM

## 2021-03-28 NOTE — Progress Notes (Signed)
This visit occurred during the SARS-CoV-2 public health emergency.  Safety protocols were in place, including screening questions prior to the visit, additional usage of staff PPE, and extensive cleaning of exam room while observing appropriate contact time as indicated for disinfecting solutions.  Subjective:   Aaron Mejia is a 70 y.o. male who presents for an Initial Medicare Annual Wellness Visit.  Review of Systems     Cardiac Risk Factors include: advanced age (>35men, >11 women);male gender     Objective:    Today's Vitals   03/28/21 0955  BP: 138/66  Pulse: (!) 58  Temp: 97.9 F (36.6 C)  TempSrc: Oral  SpO2: 99%  Weight: 137 lb 3.2 oz (62.2 kg)  Height: 5\' 5"  (1.651 m)   Body mass index is 22.83 kg/m.  Advanced Directives 03/28/2021 02/23/2020 09/06/2016 08/25/2016  Does Patient Have a Medical Advance Directive? No No No No  Does patient want to make changes to medical advance directive? - Yes (MAU/Ambulatory/Procedural Areas - Information given) - -  Would patient like information on creating a medical advance directive? No - Patient declined No - Patient declined No - Patient declined Yes (MAU/Ambulatory/Procedural Areas - Information given)    Current Medications (verified) Outpatient Encounter Medications as of 03/28/2021  Medication Sig   tamsulosin (FLOMAX) 0.4 MG CAPS capsule Take 1 capsule (0.4 mg total) by mouth daily.   Vitamin D, Ergocalciferol, (DRISDOL) 1.25 MG (50000 UNIT) CAPS capsule Take 1 capsule (50,000 Units total) by mouth every 7 (seven) days.   No facility-administered encounter medications on file as of 03/28/2021.    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   Procedure: TOTAL HIP ARTHROPLASTY;  Surgeon: 09/07/2016, MD;   Location: [facility] OR;  Service: Orthopedics;  Laterality: Left;   Family History  Problem Relation Age of Onset   Hypertension Mother    Hypertension Father    Kidney disease Father    Social History   Socioeconomic History   Marital status: Single    Spouse name: Not on file   Number of children: Not on file   Years of education: Not on file   Highest education level: Not on file  Occupational History   Not on file  Tobacco Use   Smoking status: Former   Smokeless tobacco: Never   Tobacco comments:    maybe 1 cigar per day  Vaping Use   Vaping Use: Never used  Substance and Sexual Activity   Alcohol use: Yes    Alcohol/week: 6.0 standard drinks    Types: 6 Cans of beer per week   Drug use: No   Sexual activity: Yes  Other Topics Concern   Not on file  Social History Narrative   Not on file   Social Determinants of Health   Financial Resource Strain: High Risk   Difficulty of Paying Living Expenses: Hard  Food Insecurity: No Food Insecurity   Worried About Running Out of Food in the Last Year: Never true   Ran Out of Food in the Last Year: Never true  Transportation Needs: No Transportation Needs   Lack of Transportation (Medical): No   Lack of Transportation (Non-Medical): No  Physical Activity: Sufficiently Active   Days of Exercise per Week: 7 days   Minutes of Exercise  per Session: 150+ min  Stress: No Stress Concern Present   Feeling of Stress : Not at all  Social Connections: Not on file    Tobacco Counseling Counseling given: Not Answered Tobacco comments: maybe 1 cigar per day   Clinical Intake:  Pre-visit preparation completed: Yes  Pain : No/denies pain     Nutritional Status: BMI of 19-24  Normal Nutritional Risks: None Diabetes: No  How often do you need to have someone help you when you read instructions, pamphlets, or other written materials from your doctor or pharmacy?: 1 - Never What is the last grade level you completed in school?:  11th grade  Diabetic? no  Interpreter Needed?: No  Information entered by :: NAllen LPN   Activities of Daily Living In your present state of health, do you have any difficulty performing the following activities: 03/28/2021 03/19/2021  Hearing? N N  Vision? Y N  Comment slightly blurry -  Difficulty concentrating or making decisions? N N  Walking or climbing stairs? N N  Dressing or bathing? N N  Doing errands, shopping? N N  Preparing Food and eating ? N -  Using the Toilet? N -  In the past six months, have you accidently leaked urine? N -  Do you have problems with loss of bowel control? N -  Managing your Medications? N -  Managing your Finances? N -  Housekeeping or managing your Housekeeping? N -  Some recent data might be hidden    Patient Care Team: Arnette Felts, FNP as PCP - General (General Practice)  Indicate any recent Medical Services you may have received from other than [facility] providers in the past year (date may be approximate).     Assessment:   This is a routine wellness examination for Aaron Mejia.  Hearing/Vision screen Vision Screening - Comments:: No regular eye exams,  Dietary issues and exercise activities discussed: Current Exercise Habits: Home exercise routine, Type of exercise: walking, Time (Minutes): > 60, Frequency (Times/Week): 7, Weekly Exercise (Minutes/Week): 0   Goals Addressed             This Visit's Progress    Patient Stated       03/28/2021, stay healthy       Depression Screen PHQ 2/9 Scores 03/28/2021 03/19/2021  PHQ - 2 Score 0 0  PHQ- 9 Score - 0    Fall Risk Fall Risk  03/28/2021 03/19/2021  Falls in the past year? 0 0  Number falls in past yr: - 0  Injury with Fall? - 0  Risk for fall due to : No Fall Risks -  Follow up Falls evaluation completed;Education provided;Falls prevention discussed -    FALL RISK PREVENTION PERTAINING TO THE HOME:  Any stairs in or around the home? No  If so, are there any  without handrails? N/a Home free of loose throw rugs in walkways, pet beds, electrical cords, etc? Yes  Adequate lighting in your home to reduce risk of falls? Yes   ASSISTIVE DEVICES UTILIZED TO PREVENT FALLS:  Life alert? No  Use of a cane, walker or w/c? No  Grab bars in the bathroom? No  Shower chair or bench in shower? No  Elevated toilet seat or a handicapped toilet? Yes   TIMED UP AND GO:  Was the test performed? No .      Cognitive Function:     6CIT Screen 03/28/2021  What Year? 4 points  What month? 0 points  What time? 0  points  Count back from 20 0 points  Months in reverse 4 points  Repeat phrase 8 points  Total Score 16    Immunizations Immunization History  Administered Date(s) Administered   PFIZER(Purple Top)SARS-COV-2 Vaccination 01/25/2020, 02/15/2020    TDAP status: Due, Education has been provided regarding the importance of this vaccine. Advised may receive this vaccine at local pharmacy or Health Dept. Aware to provide a copy of the vaccination record if obtained from local pharmacy or Health Dept. Verbalized acceptance and understanding.  Flu Vaccine status: Declined, Education has been provided regarding the importance of this vaccine but patient still declined. Advised may receive this vaccine at local pharmacy or Health Dept. Aware to provide a copy of the vaccination record if obtained from local pharmacy or Health Dept. Verbalized acceptance and understanding.  Pneumococcal vaccine status: Declined,  Education has been provided regarding the importance of this vaccine but patient still declined. Advised may receive this vaccine at local pharmacy or Health Dept. Aware to provide a copy of the vaccination record if obtained from local pharmacy or Health Dept. Verbalized acceptance and understanding.   Covid-19 vaccine status: Completed vaccines  Qualifies for Shingles Vaccine? Yes   Zostavax completed No   Shingrix Completed?: No.     Education has been provided regarding the importance of this vaccine. Patient has been advised to call insurance company to determine out of pocket expense if they have not yet received this vaccine. Advised may also receive vaccine at local pharmacy or Health Dept. Verbalized acceptance and understanding.  Screening Tests Health Maintenance  Topic Date Due   Pneumonia Vaccine 76+ Years old (1 - PCV) Never done   Hepatitis C Screening  Never done   TETANUS/TDAP  Never done   COLONOSCOPY (Pts 45-72yrs Insurance coverage will need to be confirmed)  Never done   Zoster Vaccines- Shingrix (1 of 2) Never done   COVID-19 Vaccine (3 - Booster for Pfizer series) 04/11/2020   INFLUENZA VACCINE  07/12/2021 (Originally 11/12/2020)   HPV VACCINES  Aged Out    Health Maintenance  Health Maintenance Due  Topic Date Due   Pneumonia Vaccine 23+ Years old (1 - PCV) Never done   Hepatitis C Screening  Never done   TETANUS/TDAP  Never done   COLONOSCOPY (Pts 45-30yrs Insurance coverage will need to be confirmed)  Never done   Zoster Vaccines- Shingrix (1 of 2) Never done   COVID-19 Vaccine (3 - Booster for Pfizer series) 04/11/2020    Colorectal cancer screening: states had in 2018  Lung Cancer Screening: (Low Dose CT Chest recommended if Age 40-80 years, 30 pack-year currently smoking OR have quit w/in 15years.) does not qualify.   Lung Cancer Screening Referral: no  Additional Screening:  Hepatitis C Screening: does qualify;   Vision Screening: Recommended annual ophthalmology exams for early detection of glaucoma and other disorders of the eye. Is the patient up to date with their annual eye exam?  No  Who is the provider or what is the name of the office in which the patient attends annual eye exams? none If pt is not established with a provider, would they like to be referred to a provider to establish care? No .   Dental Screening: Recommended annual dental exams for proper oral  hygiene  Community Resource Referral / Chronic Care Management: CRR required this visit?  No   CCM required this visit?  No      Plan:     I have  personally reviewed and noted the following in the patients chart:   Medical and social history Use of alcohol, tobacco or illicit drugs  Current medications and supplements including opioid prescriptions. Patient is not currently taking opioid prescriptions. Functional ability and status Nutritional status Physical activity Advanced directives List of other physicians Hospitalizations, surgeries, and ER visits in previous 12 months Vitals Screenings to include cognitive, depression, and falls Referrals and appointments  In addition, I have reviewed and discussed with patient certain preventive protocols, quality metrics, and best practice recommendations. A written personalized care plan for preventive services as well as general preventive health recommendations were provided to patient.     Barb Merino, LPN   [id number]   Nurse Notes: none

## 2021-03-28 NOTE — Patient Instructions (Signed)
Aaron Mejia , Thank you for taking time to come for your Medicare Wellness Visit. I appreciate your ongoing commitment to your health goals. Please review the following plan we discussed and let me know if I can assist you in the future.   Screening recommendations/referrals: Colonoscopy: states had 2018, will bring in paperwork Recommended yearly ophthalmology/optometry visit for glaucoma screening and checkup Recommended yearly dental visit for hygiene and checkup  Vaccinations: Influenza vaccine: decline Pneumococcal vaccine: decline Tdap vaccine: decline Shingles vaccine: decline   Covid-19:  02/15/2020, 01/25/2020  Advanced directives: Advance directive discussed with you today. Even though you declined this today please call our office should you change your mind and we can give you the proper paperwork for you to fill out.  Conditions/risks identified: none  Next appointment: Follow up in one year for your annual wellness visit.   Preventive Care 69 Years and Older, Male Preventive care refers to lifestyle choices and visits with your health care provider that can promote health and wellness. What does preventive care include? A yearly physical exam. This is also called an annual well check. Dental exams once or twice a year. Routine eye exams. Ask your health care provider how often you should have your eyes checked. Personal lifestyle choices, including: Daily care of your teeth and gums. Regular physical activity. Eating a healthy diet. Avoiding tobacco and drug use. Limiting alcohol use. Practicing safe sex. Taking low doses of aspirin every day. Taking vitamin and mineral supplements as recommended by your health care provider. What happens during an annual well check? The services and screenings done by your health care provider during your annual well check will depend on your age, overall health, lifestyle risk factors, and family history of disease. Counseling   Your health care provider may ask you questions about your: Alcohol use. Tobacco use. Drug use. Emotional well-being. Home and relationship well-being. Sexual activity. Eating habits. History of falls. Memory and ability to understand (cognition). Work and work Astronomer. Screening  You may have the following tests or measurements: Height, weight, and BMI. Blood pressure. Lipid and cholesterol levels. These may be checked every 5 years, or more frequently if you are over 75 years old. Skin check. Lung cancer screening. You may have this screening every year starting at age 69 if you have a 30-pack-year history of smoking and currently smoke or have quit within the past 15 years. Fecal occult blood test (FOBT) of the stool. You may have this test every year starting at age 69. Flexible sigmoidoscopy or colonoscopy. You may have a sigmoidoscopy every 5 years or a colonoscopy every 10 years starting at age 69 Prostate cancer screening. Recommendations will vary depending on your family history and other risks. Hepatitis C blood test. Hepatitis B blood test. Sexually transmitted disease (STD) testing. Diabetes screening. This is done by checking your blood sugar (glucose) after you have not eaten for a while (fasting). You may have this done every 1-3 years. Abdominal aortic aneurysm (AAA) screening. You may need this if you are a current or former smoker. Osteoporosis. You may be screened starting at age 36 if you are at high risk. Talk with your health care provider about your test results, treatment options, and if necessary, the need for more tests. Vaccines  Your health care provider may recommend certain vaccines, such as: Influenza vaccine. This is recommended every year. Tetanus, diphtheria, and acellular pertussis (Tdap, Td) vaccine. You may need a Td booster every 10 years. Zoster vaccine. You  may need this after age 87. Pneumococcal 13-valent conjugate (PCV13) vaccine.  One dose is recommended after age 69 Pneumococcal polysaccharide (PPSV23) vaccine. One dose is recommended after age 69 Talk to your health care provider about which screenings and vaccines you need and how often you need them. This information is not intended to replace advice given to you by your health care provider. Make sure you discuss any questions you have with your health care provider. Document Released: 04/27/2015 Document Revised: 12/19/2015 Document Reviewed: 01/30/2015 Elsevier Interactive Patient Education  2017 [employer].  Fall Prevention in the Home Falls can cause injuries. They can happen to people of all ages. There are many things you can do to make your home safe and to help prevent falls. What can I do on the outside of my home? Regularly fix the edges of walkways and driveways and fix any cracks. Remove anything that might make you trip as you walk through a door, such as a raised step or threshold. Trim any bushes or trees on the path to your home. Use bright outdoor lighting. Clear any walking paths of anything that might make someone trip, such as rocks or tools. Regularly check to see if handrails are loose or broken. Make sure that both sides of any steps have handrails. Any raised decks and porches should have guardrails on the edges. Have any leaves, snow, or ice cleared regularly. Use sand or salt on walking paths during winter. Clean up any spills in your garage right away. This includes oil or grease spills. What can I do in the bathroom? Use night lights. Install grab bars by the toilet and in the tub and shower. Do not use towel bars as grab bars. Use non-skid mats or decals in the tub or shower. If you need to sit down in the shower, use a plastic, non-slip stool. Keep the floor dry. Clean up any water that spills on the floor as soon as it happens. Remove soap buildup in the tub or shower regularly. Attach bath mats securely with double-sided  non-slip rug tape. Do not have throw rugs and other things on the floor that can make you trip. What can I do in the bedroom? Use night lights. Make sure that you have a light by your bed that is easy to reach. Do not use any sheets or blankets that are too big for your bed. They should not hang down onto the floor. Have a firm chair that has side arms. You can use this for support while you get dressed. Do not have throw rugs and other things on the floor that can make you trip. What can I do in the kitchen? Clean up any spills right away. Avoid walking on wet floors. Keep items that you use a lot in easy-to-reach places. If you need to reach something above you, use a strong step stool that has a grab bar. Keep electrical cords out of the way. Do not use floor polish or wax that makes floors slippery. If you must use wax, use non-skid floor wax. Do not have throw rugs and other things on the floor that can make you trip. What can I do with my stairs? Do not leave any items on the stairs. Make sure that there are handrails on both sides of the stairs and use them. Fix handrails that are broken or loose. Make sure that handrails are as long as the stairways. Check any carpeting to make sure that it is firmly  attached to the stairs. Fix any carpet that is loose or worn. Avoid having throw rugs at the top or bottom of the stairs. If you do have throw rugs, attach them to the floor with carpet tape. Make sure that you have a light switch at the top of the stairs and the bottom of the stairs. If you do not have them, ask someone to add them for you. What else can I do to help prevent falls? Wear shoes that: Do not have high heels. Have rubber bottoms. Are comfortable and fit you well. Are closed at the toe. Do not wear sandals. If you use a stepladder: Make sure that it is fully opened. Do not climb a closed stepladder. Make sure that both sides of the stepladder are locked into place. Ask  someone to hold it for you, if possible. Clearly mark and make sure that you can see: Any grab bars or handrails. First and last steps. Where the edge of each step is. Use tools that help you move around (mobility aids) if they are needed. These include: Canes. Walkers. Scooters. Crutches. Turn on the lights when you go into a dark area. Replace any light bulbs as soon as they burn out. Set up your furniture so you have a clear path. Avoid moving your furniture around. If any of your floors are uneven, fix them. If there are any pets around you, be aware of where they are. Review your medicines with your doctor. Some medicines can make you feel dizzy. This can increase your chance of falling. Ask your doctor what other things that you can do to help prevent falls. This information is not intended to replace advice given to you by your health care provider. Make sure you discuss any questions you have with your health care provider. Document Released: 01/25/2009 Document Revised: 09/06/2015 Document Reviewed: 05/05/2014 Elsevier Interactive Patient Education  2017 [employer].

## 2021-06-11 ENCOUNTER — Other Ambulatory Visit: Payer: Self-pay

## 2021-06-11 DIAGNOSIS — R350 Frequency of micturition: Secondary | ICD-10-CM

## 2021-06-11 MED ORDER — TAMSULOSIN HCL 0.4 MG PO CAPS: 0.4000 mg | ORAL_CAPSULE | Freq: Every day | ORAL | 2 refills | Status: DC

## 2021-06-25 ENCOUNTER — Other Ambulatory Visit: Payer: Self-pay

## 2021-06-25 ENCOUNTER — Encounter: Payer: Self-pay | Admitting: Nurse Practitioner

## 2021-06-25 ENCOUNTER — Ambulatory Visit (INDEPENDENT_AMBULATORY_CARE_PROVIDER_SITE_OTHER): Payer: Medicare Other | Admitting: Nurse Practitioner

## 2021-06-25 VITALS — BP 124/88 | HR 85 | Temp 98.2°F | Ht 65.0 in | Wt 134.0 lb

## 2021-06-25 DIAGNOSIS — E559 Vitamin D deficiency, unspecified: Secondary | ICD-10-CM

## 2021-06-25 DIAGNOSIS — Z0000 Encounter for general adult medical examination without abnormal findings: Secondary | ICD-10-CM

## 2021-06-25 DIAGNOSIS — M79605 Pain in left leg: Secondary | ICD-10-CM | POA: Diagnosis not present

## 2021-06-25 DIAGNOSIS — N400 Enlarged prostate without lower urinary tract symptoms: Secondary | ICD-10-CM

## 2021-06-25 DIAGNOSIS — R195 Other fecal abnormalities: Secondary | ICD-10-CM

## 2021-06-25 DIAGNOSIS — M79604 Pain in right leg: Secondary | ICD-10-CM | POA: Diagnosis not present

## 2021-06-25 NOTE — Patient Instructions (Signed)

## 2021-06-25 NOTE — Progress Notes (Signed)
?I,Victoria T Hamilton,acting as a scribe for Minette Brine, FNP.,have documented all relevant documentation on the behalf of Minette Brine, FNP,as directed by  Minette Brine, FNP while in the presence of Minette Brine, [place].  ? ?This visit occurred during the SARS-CoV-2 public health emergency.  Safety protocols were in place, including screening questions prior to the visit, additional usage of staff PPE, and extensive cleaning of exam room while observing appropriate contact time as indicated for disinfecting solutions. ? ?Subjective:  ?  ? Patient ID: Aaron Mejia , male    DOB: [birth year] , 71 y.o.   MRN: [id number] ? ? ?Chief Complaint  ?Patient presents with  ? Annual Exam  ? ? ?HPI ? ?HM. Pt reports he did complete colonoscopy in [place] in 2018, he has paperwork at home. Denies having any problems.  He had been going to [facility] prior to coming to this office. He had been having right shoulder pain.  When he walks long periods will have pain to the back of his legs.  ?  ? ?Past Medical History:  ?Diagnosis Date  ? Anemia   ? Arthritis   ? GERD (gastroesophageal reflux disease)   ?  ? ?Family History  ?Problem Relation Age of Onset  ? Hypertension Mother   ? Hypertension Father   ? Kidney disease Father   ? ? ? ?Current Outpatient Medications:  ?  tamsulosin (FLOMAX) 0.4 MG CAPS capsule, Take 1 capsule (0.4 mg total) by mouth daily., Disp: 30 capsule, Rfl: 2 ?  Vitamin D, Ergocalciferol, (DRISDOL) 1.25 MG (50000 UNIT) CAPS capsule, Take 1 capsule (50,000 Units total) by mouth every 7 (seven) days., Disp: 12 capsule, Rfl: 1  ? ?Allergies  ?Allergen Reactions  ? Penicillins Swelling  ?  Has never taken it was just told it makes him swell  ?Has patient had a PCN reaction causing immediate rash, facial/tongue/throat swelling, SOB or lightheadedness with hypotension: unknown ?Has patient had a PCN reaction causing severe rash involving mucus membranes or skin necrosis: unknown ?Has patient had a PCN  reaction that required hospitalization unknown ?Has patient had a PCN reaction occurring within the last 10 years: No ?If all of the above answers are "NO", then may proceed with Cephalosporin  ?  ? ?Men's preventive visit. Patient Health Questionnaire (PHQ-2) is  ?Flowsheet Row Clinical Support from 03/28/2021 in [facility]  ?PHQ-2 Total Score 0  ? ?  ? ?Patient is on a Regular diet eats 2 times a day Exercises with walking daily 2-3 miles. Marital status: Single. Relevant history for alcohol use is:  ?Social History  ? ?Substance and Sexual Activity  ?Alcohol Use Yes  ? Alcohol/week: 6.0 standard drinks  ? Types: 6 Cans of beer per week  ? ?Relevant history for tobacco use is:  ?Social History  ? ?Tobacco Use  ?Smoking Status Former  ?Smokeless Tobacco Never  ?Tobacco Comments  ? maybe 1 cigar per day  ?.  ? ?Review of Systems  ?Constitutional: Negative.   ?HENT: Negative.    ?Eyes: Negative.   ?Cardiovascular: Negative.   ?Genitourinary: Negative.   ?Skin: Negative.   ?Allergic/Immunologic: Negative.   ?Hematological: Negative.    ? ?Today's Vitals  ? 06/25/21 0914  ?BP: 124/88  ?Pulse: 85  ?Temp: 98.2 ?F (36.8 ?C)  ?TempSrc: Oral  ?Weight: 134 lb (60.8 kg)  ?Height: 5' 5"  (1.651 m)  ? ?Body mass index is 22.3 kg/m?.  ?Wt Readings from Last 3 Encounters:  ?  06/25/21 134 lb (60.8 kg)  ?03/28/21 137 lb 3.2 oz (62.2 kg)  ?03/19/21 137 lb 9.6 oz (62.4 kg)  ?  ?Objective:  ?Physical Exam ?Vitals reviewed.  ?Constitutional:   ?   Appearance: Normal appearance. He is obese.  ?HENT:  ?   Head: Normocephalic and atraumatic.  ?   Right Ear: Tympanic membrane, ear canal and external ear normal. There is no impacted cerumen.  ?   Left Ear: Tympanic membrane, ear canal and external ear normal. There is no impacted cerumen.  ?   Nose:  ?   Comments: Deferred - masked ?   Mouth/Throat:  ?   Comments: Deferred - masked ?Cardiovascular:  ?   Rate and Rhythm: Normal rate and regular rhythm.  ?   Pulses:  Normal pulses.  ?   Heart sounds: Normal heart sounds. No murmur heard. ?Pulmonary:  ?   Effort: Pulmonary effort is normal. No respiratory distress.  ?   Breath sounds: Normal breath sounds.  ?Abdominal:  ?   General: Abdomen is flat. Bowel sounds are normal. There is no distension.  ?   Palpations: Abdomen is soft.  ?Genitourinary: ?   Prostate: Enlarged.  ?   Rectum: Guaiac result positive.  ?Musculoskeletal:     ?   General: Normal range of motion.  ?   Cervical back: Normal range of motion and neck supple.  ?Skin: ?   General: Skin is warm.  ?   Capillary Refill: Capillary refill takes less than 2 seconds.  ?Neurological:  ?   General: No focal deficit present.  ?   Mental Status: He is alert and oriented to person, place, and time.  ?Psychiatric:     ?   Mood and Affect: Mood normal.     ?   Behavior: Behavior normal.     ?   Thought Content: Thought content normal.     ?   Judgment: Judgment normal.  ?  ? ?   ?Assessment And Plan:  ?  ?1. Encounter for annual health examination ?Behavior modifications discussed and diet history reviewed.   ?Pt will continue to exercise regularly and modify diet with low GI, plant based foods and decrease intake of processed foods.  ?Recommend intake of daily multivitamin, Vitamin D, and calcium.  ?Recommend colonoscopy (reports had done) for preventive screenings, as well as recommend immunizations that include influenza, TDAP, shingles (reports has had done) ?- POCT Urinalysis Dipstick (40981) ?- Microalbumin / creatinine urine ratio ?- CMP14+EGFR ?- Lipid panel ?- CBC ?- Hemoglobin A1c ?- PSA ?- Hepatitis C antibody ? ?2. Pain in both lower extremities ?Comments: Magnesium was slightly above normal at last visit, will check venous doppler reflux to check for circulation  ?- VAS [place] LOWER EXTREMITY VENOUS REFLUX; Future ? ?3. Vitamin D deficiency ?Comments: Continue taking vitamin d supplement, will check levels.  ?- VITAMIN D 25 Hydroxy (Vit-D Deficiency, Fractures) ? ?4.  Guaiac positive stools ?Comments: He is to provide the GI provider he has been to for his Colonoscopy so I can refer  ? ?5. Enlarged prostate ?Comments: Will check PSA, pending results will refer to Urology ? ? ? ? ?Patient was given opportunity to ask questions. Patient verbalized understanding of the plan and was able to repeat key elements of the plan. All questions were answered to their satisfaction.  ? ?Minette Brine, FNP  ? ?I, Minette Brine, FNP, have reviewed all documentation for this visit. The documentation on 06/25/21 for the exam, diagnosis,  procedures, and orders are all accurate and complete.  ? ?THE PATIENT IS ENCOURAGED TO PRACTICE SOCIAL DISTANCING DUE TO THE COVID-19 PANDEMIC.   ?

## 2021-06-26 LAB — CBC
Hematocrit: 39.9 % (ref 37.5–51.0)
Hemoglobin: 12.8 g/dL — ABNORMAL LOW (ref 13.0–17.7)
MCH: 26.8 pg (ref 26.6–33.0)
MCHC: 32.1 g/dL (ref 31.5–35.7)
MCV: 84 fL (ref 79–97)
Platelets: 311 10*3/uL (ref 150–450)
RBC: 4.78 x10E6/uL (ref 4.14–5.80)
RDW: 14 % (ref 11.6–15.4)
WBC: 6.2 10*3/uL (ref 3.4–10.8)

## 2021-06-26 LAB — CMP14+EGFR
ALT: 19 IU/L (ref 0–44)
AST: 22 IU/L (ref 0–40)
Albumin/Globulin Ratio: 1.9 (ref 1.2–2.2)
Albumin: 4.7 g/dL (ref 3.8–4.8)
Alkaline Phosphatase: 75 IU/L (ref 44–121)
BUN/Creatinine Ratio: 12 (ref 10–24)
BUN: 14 mg/dL (ref 8–27)
Bilirubin Total: 0.4 mg/dL (ref 0.0–1.2)
CO2: 25 mmol/L (ref 20–29)
Calcium: 9.7 mg/dL (ref 8.6–10.2)
Chloride: 105 mmol/L (ref 96–106)
Creatinine, Ser: 1.19 mg/dL (ref 0.76–1.27)
Globulin, Total: 2.5 g/dL (ref 1.5–4.5)
Glucose: 113 mg/dL — ABNORMAL HIGH (ref 70–99)
Potassium: 4.7 mmol/L (ref 3.5–5.2)
Sodium: 142 mmol/L (ref 134–144)
Total Protein: 7.2 g/dL (ref 6.0–8.5)
eGFR: 66 mL/min/{1.73_m2} (ref >59)

## 2021-06-26 LAB — LIPID PANEL
Chol/HDL Ratio: 3.7 ratio (ref 0.0–5.0)
Cholesterol, Total: 194 mg/dL (ref 100–199)
HDL: 52 mg/dL (ref >39)
LDL Chol Calc (NIH): 126 mg/dL — ABNORMAL HIGH (ref 0–99)
Triglycerides: 90 mg/dL (ref 0–149)
VLDL Cholesterol Cal: 16 mg/dL (ref 5–40)

## 2021-06-26 LAB — PSA: Prostate Specific Ag, Serum: 0.7 ng/mL (ref 0.0–4.0)

## 2021-06-26 LAB — HEMOGLOBIN A1C
Est. average glucose Bld gHb Est-mCnc: 114 mg/dL
Hgb A1c MFr Bld: 5.6 % (ref 4.8–5.6)

## 2021-06-26 LAB — HEPATITIS C ANTIBODY: Hep C Virus Ab: NONREACTIVE

## 2021-06-26 LAB — VITAMIN D 25 HYDROXY (VIT D DEFICIENCY, FRACTURES): Vit D, 25-Hydroxy: 65.5 ng/mL (ref 30.0–100.0)

## 2021-06-27 ENCOUNTER — Telehealth: Payer: Self-pay

## 2021-06-27 NOTE — Telephone Encounter (Signed)
Error

## 2021-07-09 ENCOUNTER — Other Ambulatory Visit: Payer: Self-pay

## 2021-07-09 ENCOUNTER — Ambulatory Visit (HOSPITAL_COMMUNITY)
Admission: RE | Admit: 2021-07-09 | Discharge: 2021-07-09 | Disposition: A | Discharge status: Home / Self Care | Payer: Medicare Other | Source: Ambulatory Visit | Attending: Nurse Practitioner | Admitting: Nurse Practitioner

## 2021-07-09 DIAGNOSIS — M79604 Pain in right leg: Secondary | ICD-10-CM | POA: Diagnosis not present

## 2021-07-09 DIAGNOSIS — M79605 Pain in left leg: Secondary | ICD-10-CM | POA: Insufficient documentation

## 2021-07-26 IMAGING — CR DG THORACIC SPINE 3V
3 series · 3 of 3 positions shown · non-contrast
Comparison: None.

CLINICAL DATA: Right shoulder pain, right-sided upper back pain for
3 months. No injury.

EXAM:
THORACIC SPINE - 3 VIEWS

[t t-spine a.p.]
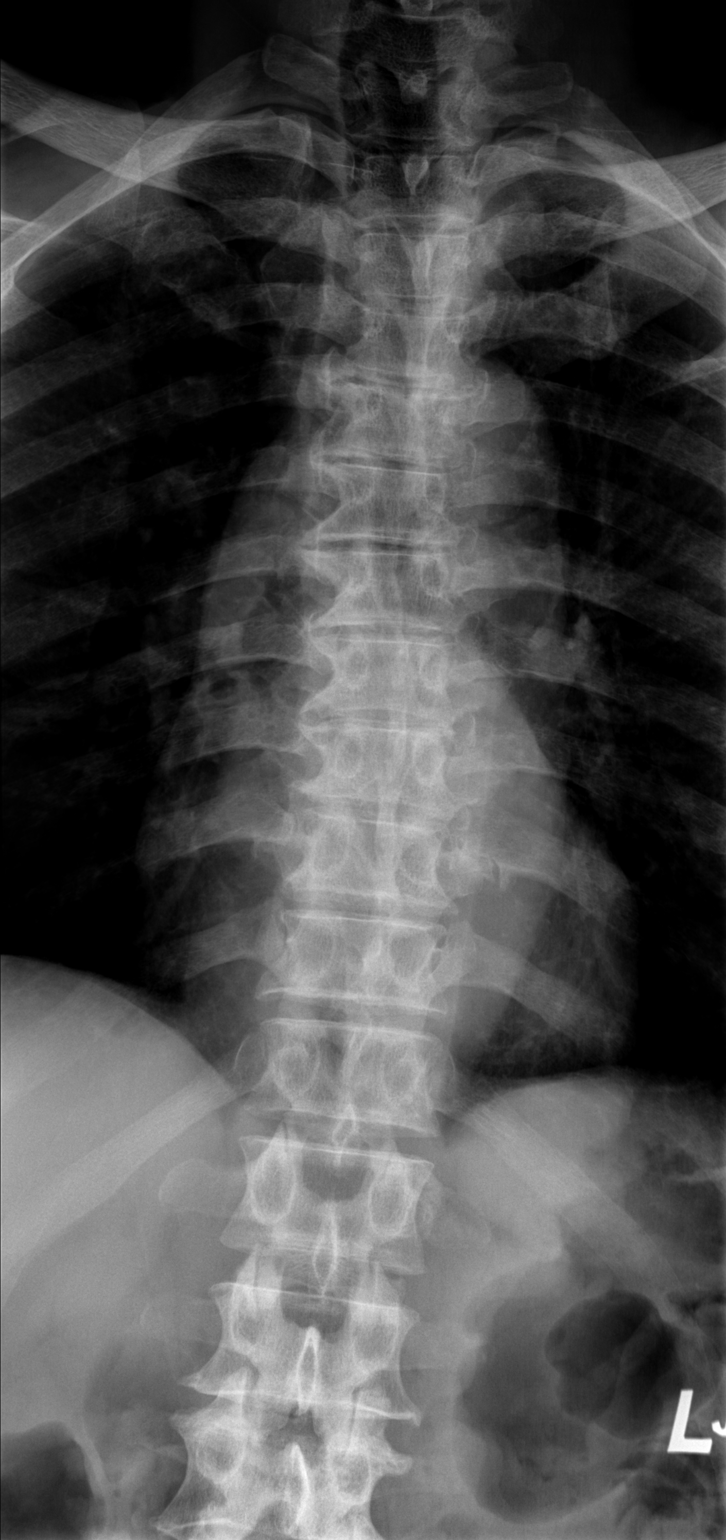

[t t-spine lat]
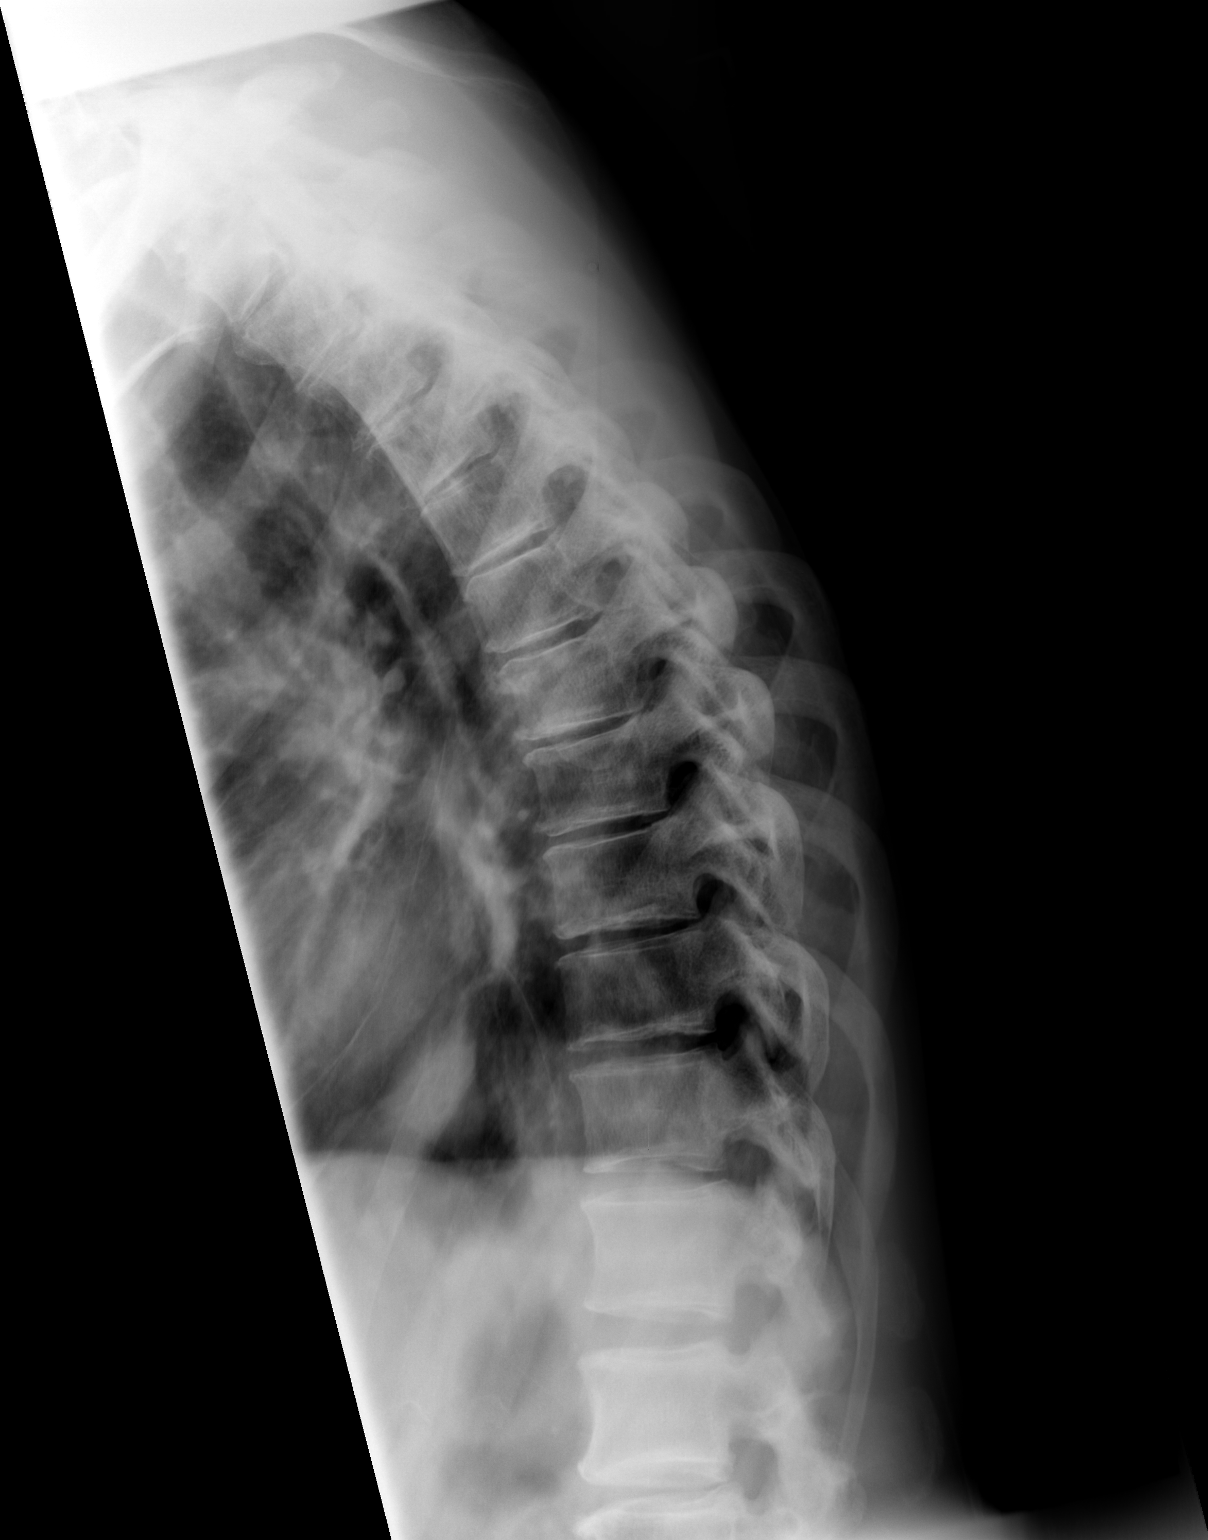

[t swimmers]
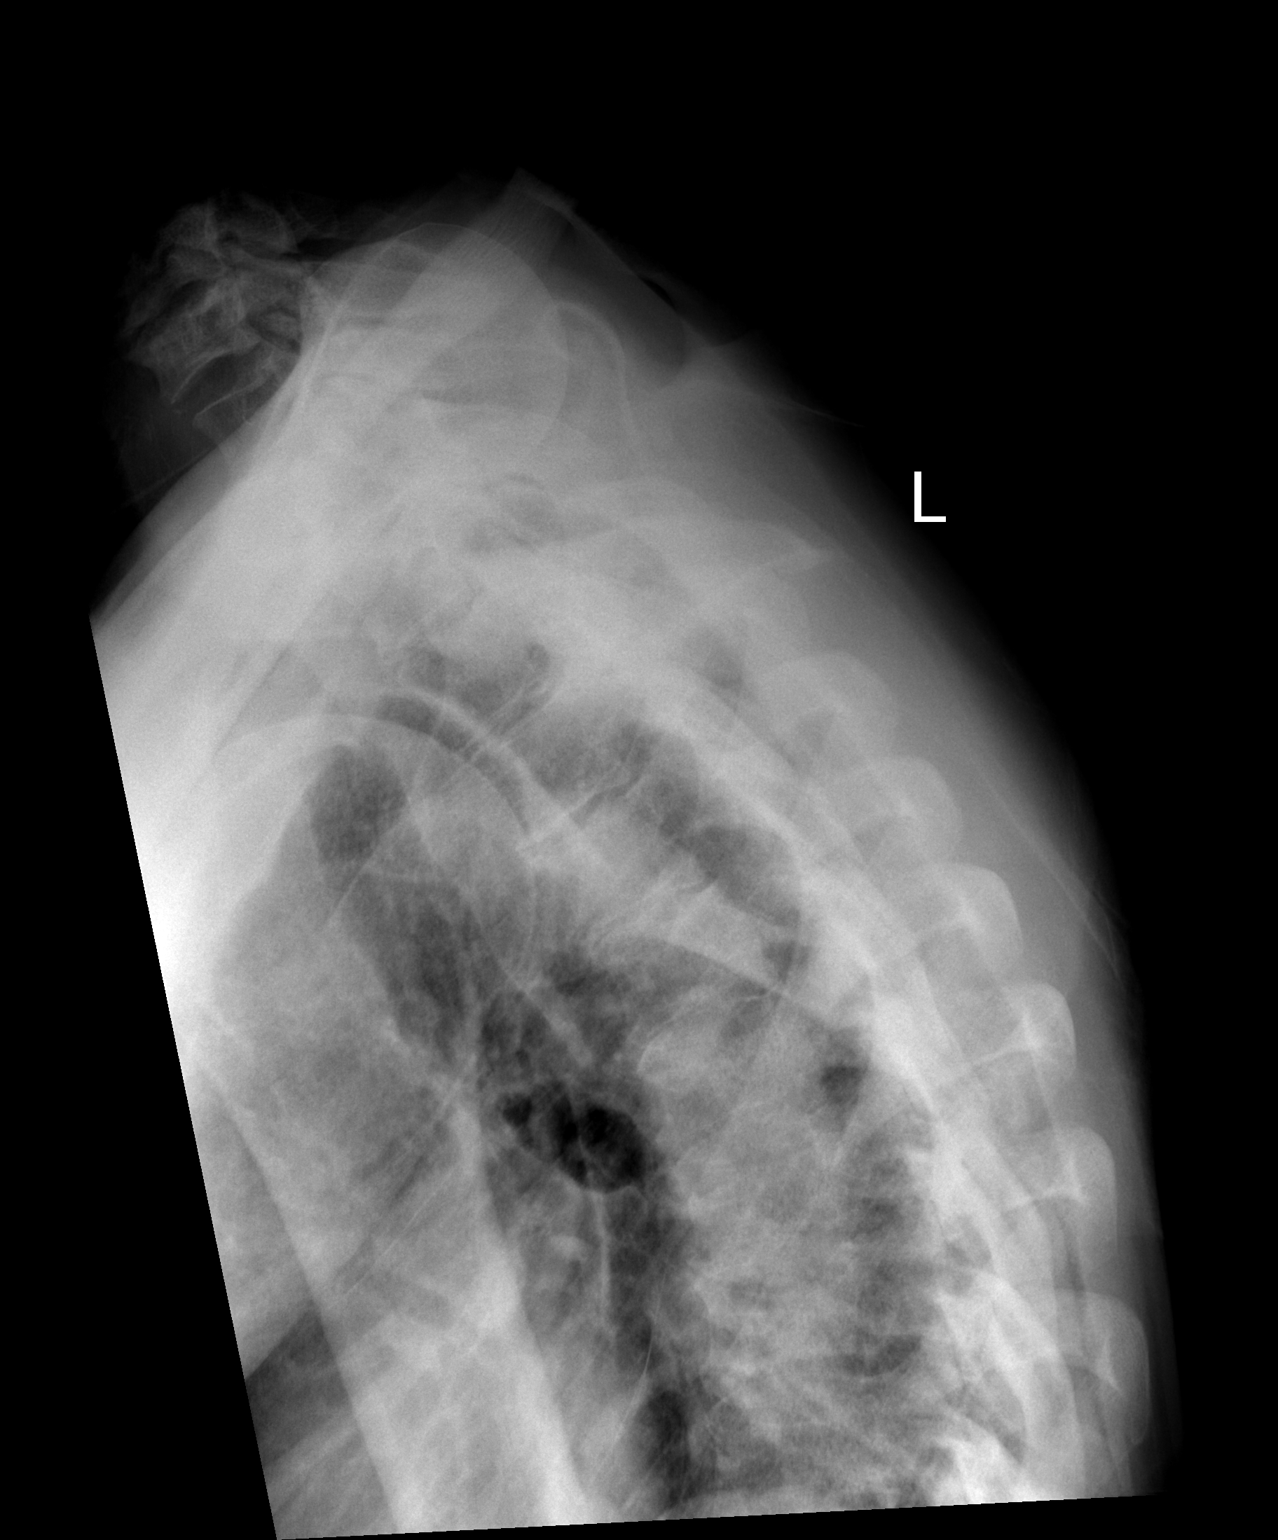

[3 of 3 positions shown; findings below may reference images not displayed]

FINDINGS: Alignment is anatomic. Vertebral body height is maintained. Mild
anterior endplate osteophytosis in the mid and lower thoracic spine.
Cervicothoracic junction is obscured by the patient's arms.
IMPRESSION: Mild mid and lower thoracic degenerative changes.

## 2021-09-03 ENCOUNTER — Other Ambulatory Visit: Payer: Self-pay

## 2021-09-03 DIAGNOSIS — R350 Frequency of micturition: Secondary | ICD-10-CM

## 2021-09-03 MED ORDER — VITAMIN D (ERGOCALCIFEROL) 1.25 MG (50000 UNIT) PO CAPS: 50000.0000 [IU] | ORAL_CAPSULE | ORAL | 1 refills | Status: DC

## 2021-09-03 MED ORDER — TAMSULOSIN HCL 0.4 MG PO CAPS: 0.4000 mg | ORAL_CAPSULE | Freq: Every day | ORAL | 2 refills | Status: DC

## 2021-11-12 ENCOUNTER — Other Ambulatory Visit: Payer: Self-pay

## 2021-11-12 DIAGNOSIS — R350 Frequency of micturition: Secondary | ICD-10-CM

## 2021-11-12 MED ORDER — TAMSULOSIN HCL 0.4 MG PO CAPS: 0.4000 mg | ORAL_CAPSULE | Freq: Every day | ORAL | 2 refills | Status: DC

## 2021-12-25 ENCOUNTER — Ambulatory Visit (INDEPENDENT_AMBULATORY_CARE_PROVIDER_SITE_OTHER): Payer: Medicare Other | Admitting: Nurse Practitioner

## 2021-12-25 ENCOUNTER — Encounter: Payer: Self-pay | Admitting: Nurse Practitioner

## 2021-12-25 VITALS — BP 140/90 | HR 81 | Temp 98.3°F | Ht 65.0 in | Wt 140.0 lb

## 2021-12-25 DIAGNOSIS — R030 Elevated blood-pressure reading, without diagnosis of hypertension: Secondary | ICD-10-CM

## 2021-12-25 DIAGNOSIS — Z6823 Body mass index (BMI) 23.0-23.9, adult: Secondary | ICD-10-CM

## 2021-12-25 DIAGNOSIS — Z2821 Immunization not carried out because of patient refusal: Secondary | ICD-10-CM | POA: Diagnosis not present

## 2021-12-25 DIAGNOSIS — N400 Enlarged prostate without lower urinary tract symptoms: Secondary | ICD-10-CM | POA: Diagnosis not present

## 2021-12-25 DIAGNOSIS — E559 Vitamin D deficiency, unspecified: Secondary | ICD-10-CM | POA: Diagnosis not present

## 2021-12-25 NOTE — Patient Instructions (Signed)
Low-Sodium Eating Plan Sodium, which is an element that makes up salt, helps you maintain a healthy balance of fluids in your body. Too much sodium can increase your blood pressure and cause fluid and waste to be held in your body. Your health care provider or dietitian may recommend following this plan if you have high blood pressure (hypertension), kidney disease, liver disease, or heart failure. Eating less sodium can help lower your blood pressure, reduce swelling, and protect your heart, liver, and kidneys. What are tips for following this plan? Reading food labels The Nutrition Facts label lists the amount of sodium in one serving of the food. If you eat more than one serving, you must multiply the listed amount of sodium by the number of servings. Choose foods with less than 140 mg of sodium per serving. Avoid foods with 300 mg of sodium or more per serving. Shopping  Look for lower-sodium products, often labeled as "low-sodium" or "no salt added." Always check the sodium content, even if foods are labeled as "unsalted" or "no salt added." Buy fresh foods. Avoid canned foods and pre-made or frozen meals. Avoid canned, cured, or processed meats. Buy breads that have less than 80 mg of sodium per slice. Cooking  Eat more home-cooked food and less restaurant, buffet, and fast food. Avoid adding salt when cooking. Use salt-free seasonings or herbs instead of table salt or sea salt. Check with your health care provider or pharmacist before using salt substitutes. Cook with plant-based oils, such as canola, sunflower, or olive oil. Meal planning When eating at a restaurant, ask that your food be prepared with less salt or no salt, if possible. Avoid dishes labeled as brined, pickled, cured, smoked, or made with soy sauce, miso, or teriyaki sauce. Avoid foods that contain MSG (monosodium glutamate). MSG is sometimes added to Chinese food, bouillon, and some canned foods. Make meals that can  be grilled, baked, poached, roasted, or steamed. These are generally made with less sodium. General information Most people on this plan should limit their sodium intake to 1,500-2,000 mg (milligrams) of sodium each day. What foods should I eat? Fruits Fresh, frozen, or canned fruit. Fruit juice. Vegetables Fresh or frozen vegetables. "No salt added" canned vegetables. "No salt added" tomato sauce and paste. Low-sodium or reduced-sodium tomato and vegetable juice. Grains Low-sodium cereals, including oats, puffed wheat and rice, and shredded wheat. Low-sodium crackers. Unsalted rice. Unsalted pasta. Low-sodium bread. Whole-grain breads and whole-grain pasta. Meats and other proteins Fresh or frozen (no salt added) meat, poultry, seafood, and fish. Low-sodium canned tuna and salmon. Unsalted nuts. Dried peas, beans, and lentils without added salt. Unsalted canned beans. Eggs. Unsalted nut butters. Dairy Milk. Soy milk. Cheese that is naturally low in sodium, such as ricotta cheese, fresh mozzarella, or Swiss cheese. Low-sodium or reduced-sodium cheese. Cream cheese. Yogurt. Seasonings and condiments Fresh and dried herbs and spices. Salt-free seasonings. Low-sodium mustard and ketchup. Sodium-free salad dressing. Sodium-free light mayonnaise. Fresh or refrigerated horseradish. Lemon juice. Vinegar. Other foods Homemade, reduced-sodium, or low-sodium soups. Unsalted popcorn and pretzels. Low-salt or salt-free chips. The items listed above may not be a complete list of foods and beverages you can eat. Contact a dietitian for more information. What foods should I avoid? Vegetables Sauerkraut, pickled vegetables, and relishes. Olives. French fries. Onion rings. Regular canned vegetables (not low-sodium or reduced-sodium). Regular canned tomato sauce and paste (not low-sodium or reduced-sodium). Regular tomato and vegetable juice (not low-sodium or reduced-sodium). Frozen vegetables in  sauces. Grains   Instant hot cereals. Bread stuffing, pancake, and biscuit mixes. Croutons. Seasoned rice or pasta mixes. Noodle soup cups. Boxed or frozen macaroni and cheese. Regular salted crackers. Self-rising flour. Meats and other proteins Meat or fish that is salted, canned, smoked, spiced, or pickled. Precooked or cured meat, such as sausages or meat loaves. Bacon. Ham. Pepperoni. Hot dogs. Corned beef. Chipped beef. Salt pork. Jerky. Pickled herring. Anchovies and sardines. Regular canned tuna. Salted nuts. Dairy Processed cheese and cheese spreads. Hard cheeses. Cheese curds. Blue cheese. Feta cheese. String cheese. Regular cottage cheese. Buttermilk. Canned milk. Fats and oils Salted butter. Regular margarine. Ghee. Bacon fat. Seasonings and condiments Onion salt, garlic salt, seasoned salt, table salt, and sea salt. Canned and packaged gravies. Worcestershire sauce. Tartar sauce. Barbecue sauce. Teriyaki sauce. Soy sauce, including reduced-sodium. Steak sauce. Fish sauce. Oyster sauce. Cocktail sauce. Horseradish that you find on the shelf. Regular ketchup and mustard. Meat flavorings and tenderizers. Bouillon cubes. Hot sauce. Pre-made or packaged marinades. Pre-made or packaged taco seasonings. Relishes. Regular salad dressings. Salsa. Other foods Salted popcorn and pretzels. Corn chips and puffs. Potato and tortilla chips. Canned or dried soups. Pizza. Frozen entrees and pot pies. The items listed above may not be a complete list of foods and beverages you should avoid. Contact a dietitian for more information. Summary Eating less sodium can help lower your blood pressure, reduce swelling, and protect your heart, liver, and kidneys. Most people on this plan should limit their sodium intake to 1,500-2,000 mg (milligrams) of sodium each day. Canned, boxed, and frozen foods are high in sodium. Restaurant foods, fast foods, and pizza are also very high in sodium. You also get sodium by  adding salt to food. Try to cook at home, eat more fresh fruits and vegetables, and eat less fast food and canned, processed, or prepared foods. This information is not intended to replace advice given to you by your health care provider. Make sure you discuss any questions you have with your health care provider. Document Revised: 05/06/2019 Document Reviewed: 03/02/2019 Elsevier Patient Education  2023 [employer]  

## 2021-12-25 NOTE — Progress Notes (Signed)
Jeri Cos [place],acting as a Neurosurgeon for Arnette Felts, FNP.,have documented all relevant documentation on the behalf of Arnette Felts, FNP,as directed by  Arnette Felts, FNP while in the presence of Arnette Felts, FNP.    Subjective:     Patient ID: Aaron Mejia , male    DOB: 1950-10-15 , 71 y.o.   MRN: [id number]   Chief Complaint  Patient presents with   med check    HPI  Patient presents today for med check. He is needing a refill on his flomax. He reports being under more stress today due to tree falling on his windshield. Has been busy today running his family around  Dana Readings from Last 3 Encounters: 12/25/21 : 140 lb (63.5 kg) 06/25/21 : 134 lb (60.8 kg) 03/28/21 : 137 lb 3.2 oz (62.2 kg)       Past Medical History:  Diagnosis Date   Anemia    Arthritis    GERD (gastroesophageal reflux disease)      Family History  Problem Relation Age of Onset   Hypertension Mother    Hypertension Father    Kidney disease Father      Current Outpatient Medications:    tamsulosin (FLOMAX) 0.4 MG CAPS capsule, Take 1 capsule (0.4 mg total) by mouth daily., Disp: 30 capsule, Rfl: 2   Vitamin D, Ergocalciferol, (DRISDOL) 1.25 MG (50000 UNIT) CAPS capsule, Take 1 capsule (50,000 Units total) by mouth every 7 (seven) days. (Patient not taking: Reported on 12/25/2021), Disp: 12 capsule, Rfl: 1   Allergies  Allergen Reactions   Penicillins Swelling    Has never taken it was just told it makes him swell  Has patient had a PCN reaction causing immediate rash, facial/tongue/throat swelling, SOB or lightheadedness with hypotension: unknown Has patient had a PCN reaction causing severe rash involving mucus membranes or skin necrosis: unknown Has patient had a PCN reaction that required hospitalization unknown Has patient had a PCN reaction occurring within the last 10 years: No If all of the above answers are "NO", then may proceed with Cephalosporin     Review of Systems   Constitutional: Negative.   HENT: Negative.    Eyes: Negative.   Respiratory: Negative.  Negative for apnea and cough.   Cardiovascular: Negative.   Genitourinary: Negative.   Skin: Negative.   Allergic/Immunologic: Negative.   Neurological:  Negative for dizziness (none currently).  Hematological: Negative.   Psychiatric/Behavioral: Negative.       Today's Vitals   12/25/21 1620  BP: (!) 140/90  Pulse: 81  Temp: 98.3 F (36.8 C)  Weight: 140 lb (63.5 kg)  Height: 5\' 5"  (1.651 m)  PainSc: 0-No pain   Body mass index is 23.3 kg/m.   Objective:  Physical Exam Vitals reviewed.  Constitutional:      General: He is not in acute distress.    Appearance: Normal appearance.  Cardiovascular:     Rate and Rhythm: Normal rate and regular rhythm.     Pulses: Normal pulses.     Heart sounds: Normal heart sounds. No murmur heard. Pulmonary:     Effort: Pulmonary effort is normal. No respiratory distress.     Breath sounds: Normal breath sounds. No wheezing.  Skin:    General: Skin is warm.     Capillary Refill: Capillary refill takes less than 2 seconds.  Neurological:     General: No focal deficit present.     Mental Status: He is alert and oriented to person, place, and  time.     Cranial Nerves: No cranial nerve deficit.     Motor: No weakness.  Psychiatric:        Mood and Affect: Mood normal.        Behavior: Behavior normal.        Thought Content: Thought content normal.        Judgment: Judgment normal.         Assessment And Plan:     1. Elevated systolic blood pressure reading without diagnosis of hypertension Comments: Repeat blood pressure improved. He is advised to limit intake of salt and to stay well hydrated with water.   2. Vitamin D deficiency Will check vitamin D level and supplement as needed.    Also encouraged to spend 15 minutes in the sun daily.  - Vitamin D (25 hydroxy)  3. Enlarged prostate Comments: Refill for flomax.   4. Influenza  vaccination declined Patient declined influenza vaccination at this time. Patient is aware that influenza vaccine prevents illness in 70% of healthy people, and reduces hospitalizations to 30-70% in elderly. This vaccine is recommended annually. Education has been provided regarding the importance of this vaccine but patient still declined. Advised may receive this vaccine at local pharmacy or Health Dept.or vaccine clinic. Aware to provide a copy of the vaccination record if obtained from local pharmacy or Health Dept.  Pt is willing to accept risk associated with refusing vaccination.  5. BMI 23.0-23.9, adult    Patient was given opportunity to ask questions. Patient verbalized understanding of the plan and was able to repeat key elements of the plan. All questions were answered to their satisfaction.  Arnette Felts, FNP    I, Arnette Felts, FNP, have reviewed all documentation for this visit. The documentation on 12/25/21 for the exam, diagnosis, procedures, and orders are all accurate and complete.  IF YOU HAVE BEEN REFERRED TO A SPECIALIST, IT MAY TAKE 1-2 WEEKS TO SCHEDULE/PROCESS THE REFERRAL. IF YOU HAVE NOT HEARD FROM US/SPECIALIST IN TWO WEEKS, PLEASE GIVE [place] A CALL AT [phone] X [phone].   THE PATIENT IS ENCOURAGED TO PRACTICE SOCIAL DISTANCING DUE TO THE COVID-19 PANDEMIC.

## 2022-01-06 ENCOUNTER — Encounter: Payer: Self-pay | Admitting: Nurse Practitioner

## 2022-02-16 ENCOUNTER — Other Ambulatory Visit: Payer: Self-pay | Admitting: Nurse Practitioner

## 2022-02-16 DIAGNOSIS — R350 Frequency of micturition: Secondary | ICD-10-CM

## 2022-04-10 ENCOUNTER — Other Ambulatory Visit: Payer: Self-pay

## 2022-04-10 ENCOUNTER — Ambulatory Visit: Payer: Medicare Other

## 2022-04-17 ENCOUNTER — Ambulatory Visit (INDEPENDENT_AMBULATORY_CARE_PROVIDER_SITE_OTHER): Payer: MEDICARE

## 2022-04-17 VITALS — Ht 67.0 in | Wt 145.0 lb

## 2022-04-17 DIAGNOSIS — Z0000 Encounter for general adult medical examination without abnormal findings: Secondary | ICD-10-CM

## 2022-04-17 NOTE — Patient Instructions (Signed)
Aaron Mejia , Thank you for taking time to come for your Medicare Wellness Visit. I appreciate your ongoing commitment to your health goals. Please review the following plan we discussed and let me know if I can assist you in the future.   These are the goals we discussed:  Goals      Patient Stated     03/28/2021, stay healthy     Patient Stated     04/17/2022, no goals        This is a list of the screening recommended for you and due dates:  Health Maintenance  Topic Date Due   DTaP/Tdap/Td vaccine (1 - Tdap) Never done   Colon Cancer Screening  Never done   COVID-19 Vaccine (3 - 2023-24 season) 05/03/2022*   Flu Shot  07/13/2022*   Zoster (Shingles) Vaccine (1 of 2) 07/17/2022*   Pneumonia Vaccine (1 - PCV) 04/18/2023*   Medicare Annual Wellness Visit  04/18/2023   Hepatitis C Screening: USPSTF Recommendation to screen - Ages 18-79 yo.  Completed   HPV Vaccine  Aged Out  *Topic was postponed. The date shown is not the original due date.    Advanced directives: Advance directive discussed with you today.   Conditions/risks identified: none  Next appointment: Follow up in one year for your annual wellness visit.   Preventive Care 71 Years and Older, Male  Preventive care refers to lifestyle choices and visits with your health care provider that can promote health and wellness. What does preventive care include? A yearly physical exam. This is also called an annual well check. Dental exams once or twice a year. Routine eye exams. Ask your health care provider how often you should have your eyes checked. Personal lifestyle choices, including: Daily care of your teeth and gums. Regular physical activity. Eating a healthy diet. Avoiding tobacco and drug use. Limiting alcohol use. Practicing safe sex. Taking low doses of aspirin every day. Taking vitamin and mineral supplements as recommended by your health care provider. What happens during an annual well check? The  services and screenings done by your health care provider during your annual well check will depend on your age, overall health, lifestyle risk factors, and family history of disease. Counseling  Your health care provider may ask you questions about your: Alcohol use. Tobacco use. Drug use. Emotional well-being. Home and relationship well-being. Sexual activity. Eating habits. History of falls. Memory and ability to understand (cognition). Work and work Statistician. Screening  You may have the following tests or measurements: Height, weight, and BMI. Blood pressure. Lipid and cholesterol levels. These may be checked every 5 years, or more frequently if you are over 28 years old. Skin check. Lung cancer screening. You may have this screening every year starting at age 110 if you have a 30-pack-year history of smoking and currently smoke or have quit within the past 15 years. Fecal occult blood test (FOBT) of the stool. You may have this test every year starting at age 24. Flexible sigmoidoscopy or colonoscopy. You may have a sigmoidoscopy every 5 years or a colonoscopy every 10 years starting at age 8. Prostate cancer screening. Recommendations will vary depending on your family history and other risks. Hepatitis C blood test. Hepatitis B blood test. Sexually transmitted disease (STD) testing. Diabetes screening. This is done by checking your blood sugar (glucose) after you have not eaten for a while (fasting). You may have this done every 1-3 years. Abdominal aortic aneurysm (AAA) screening. You may need  this if you are a current or former smoker. Osteoporosis. You may be screened starting at age 108 if you are at high risk. Talk with your health care provider about your test results, treatment options, and if necessary, the need for more tests. Vaccines  Your health care provider may recommend certain vaccines, such as: Influenza vaccine. This is recommended every year. Tetanus,  diphtheria, and acellular pertussis (Tdap, Td) vaccine. You may need a Td booster every 10 years. Zoster vaccine. You may need this after age 63. Pneumococcal 13-valent conjugate (PCV13) vaccine. One dose is recommended after age 23. Pneumococcal polysaccharide (PPSV23) vaccine. One dose is recommended after age 12. Talk to your health care provider about which screenings and vaccines you need and how often you need them. This information is not intended to replace advice given to you by your health care provider. Make sure you discuss any questions you have with your health care provider. Document Released: 04/27/2015 Document Revised: 12/19/2015 Document Reviewed: 01/30/2015 Elsevier Interactive Patient Education  2017 [place] Prevention in the Home Falls can cause injuries. They can happen to people of all ages. There are many things you can do to make your home safe and to help prevent falls. What can I do on the outside of my home? Regularly fix the edges of walkways and driveways and fix any cracks. Remove anything that might make you trip as you walk through a door, such as a raised step or threshold. Trim any bushes or trees on the path to your home. Use bright outdoor lighting. Clear any walking paths of anything that might make someone trip, such as rocks or tools. Regularly check to see if handrails are loose or broken. Make sure that both sides of any steps have handrails. Any raised decks and porches should have guardrails on the edges. Have any leaves, snow, or ice cleared regularly. Use sand or salt on walking paths during winter. Clean up any spills in your garage right away. This includes oil or grease spills. What can I do in the bathroom? Use night lights. Install grab bars by the toilet and in the tub and shower. Do not use towel bars as grab bars. Use non-skid mats or decals in the tub or shower. If you need to sit down in the shower, use a plastic,  non-slip stool. Keep the floor dry. Clean up any water that spills on the floor as soon as it happens. Remove soap buildup in the tub or shower regularly. Attach bath mats securely with double-sided non-slip rug tape. Do not have throw rugs and other things on the floor that can make you trip. What can I do in the bedroom? Use night lights. Make sure that you have a light by your bed that is easy to reach. Do not use any sheets or blankets that are too big for your bed. They should not hang down onto the floor. Have a firm chair that has side arms. You can use this for support while you get dressed. Do not have throw rugs and other things on the floor that can make you trip. What can I do in the kitchen? Clean up any spills right away. Avoid walking on wet floors. Keep items that you use a lot in easy-to-reach places. If you need to reach something above you, use a strong step stool that has a grab bar. Keep electrical cords out of the way. Do not use floor polish or wax that makes floors  slippery. If you must use wax, use non-skid floor wax. Do not have throw rugs and other things on the floor that can make you trip. What can I do with my stairs? Do not leave any items on the stairs. Make sure that there are handrails on both sides of the stairs and use them. Fix handrails that are broken or loose. Make sure that handrails are as long as the stairways. Check any carpeting to make sure that it is firmly attached to the stairs. Fix any carpet that is loose or worn. Avoid having throw rugs at the top or bottom of the stairs. If you do have throw rugs, attach them to the floor with carpet tape. Make sure that you have a light switch at the top of the stairs and the bottom of the stairs. If you do not have them, ask someone to add them for you. What else can I do to help prevent falls? Wear shoes that: Do not have high heels. Have rubber bottoms. Are comfortable and fit you well. Are closed  at the toe. Do not wear sandals. If you use a stepladder: Make sure that it is fully opened. Do not climb a closed stepladder. Make sure that both sides of the stepladder are locked into place. Ask someone to hold it for you, if possible. Clearly mark and make sure that you can see: Any grab bars or handrails. First and last steps. Where the edge of each step is. Use tools that help you move around (mobility aids) if they are needed. These include: Canes. Walkers. Scooters. Crutches. Turn on the lights when you go into a dark area. Replace any light bulbs as soon as they burn out. Set up your furniture so you have a clear path. Avoid moving your furniture around. If any of your floors are uneven, fix them. If there are any pets around you, be aware of where they are. Review your medicines with your doctor. Some medicines can make you feel dizzy. This can increase your chance of falling. Ask your doctor what other things that you can do to help prevent falls. This information is not intended to replace advice given to you by your health care provider. Make sure you discuss any questions you have with your health care provider. Document Released: 01/25/2009 Document Revised: 09/06/2015 Document Reviewed: 05/05/2014 Elsevier Interactive Patient Education  2017 [employer].

## 2022-04-17 NOTE — Progress Notes (Signed)
I connected with Aaron Mejia today by telephone and verified that I am speaking with the correct person using two identifiers. Location patient: home Location provider: work Persons participating in the virtual visit: Aarush Stukey, Glenna Durand LPN.   I discussed the limitations, risks, security and privacy concerns of performing an evaluation and management service by telephone and the availability of in person appointments. I also discussed with the patient that there may be a patient responsible charge related to this service. The patient expressed understanding and verbally consented to this telephonic visit.    Interactive audio and video telecommunications were attempted between this provider and patient, however failed, due to patient having technical difficulties OR patient did not have access to video capability.  We continued and completed visit with audio only.     Vital signs may be patient reported or missing.  Subjective:   Aaron Mejia is a 72 y.o. male who presents for Medicare Annual/Subsequent preventive examination.  Review of Systems     Cardiac Risk Factors include: advanced age (>20men, >22 women);male gender     Objective:    Today's Vitals   04/17/22 1147  Weight: 145 lb (65.8 kg)  Height: 5\' 7"  (1.702 m)   Body mass index is 22.71 kg/m.     04/17/2022   11:51 AM 03/28/2021   10:05 AM 02/23/2020    8:12 AM 09/06/2016    8:00 AM 08/25/2016    9:57 AM  Advanced Directives  Does Patient Have a Medical Advance Directive? No No No No No  Does patient want to make changes to medical advance directive?   Yes (MAU/Ambulatory/Procedural Areas - Information given)    Would patient like information on creating a medical advance directive?  No - Patient declined No - Patient declined No - Patient declined Yes (MAU/Ambulatory/Procedural Areas - Information given)    Current Medications (verified) Outpatient Encounter Medications as of 04/17/2022  Medication  Sig   tamsulosin (FLOMAX) 0.4 MG CAPS capsule TAKE 1 CAPSULE(0.4 MG) BY MOUTH DAILY   Vitamin D, Ergocalciferol, (DRISDOL) 1.25 MG (50000 UNIT) CAPS capsule Take 1 capsule (50,000 Units total) by mouth every 7 (seven) days. (Patient not taking: Reported on 12/25/2021)   No facility-administered encounter medications on file as of 04/17/2022.    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   Procedure: TOTAL HIP ARTHROPLASTY;  Surgeon: Earlie Server, MD;  Location: [place];  Service: Orthopedics;  Laterality: Left;   Family History  Problem Relation Age of Onset   Hypertension Mother    Hypertension Father    Kidney disease Father    Social History   Socioeconomic History   Marital status: Single    Spouse name: Not on file   Number of children: Not on file   Years of education: Not on file   Highest education level: Not on file  Occupational History   Not on file  Tobacco Use   Smoking status: Former   Smokeless tobacco: Never   Tobacco comments:    maybe 1 cigar per day  Vaping Use   Vaping Use: Never used  Substance and Sexual Activity   Alcohol use: Yes    Alcohol/week: 6.0 standard drinks of alcohol    Types: 6 Cans of beer per week  Drug use: No   Sexual activity: Yes  Other Topics Concern   Not on file  Social History Narrative   Not on file   Social Determinants of Health   Financial Resource Strain: Low Risk  (04/17/2022)   Overall Financial Resource Strain (CARDIA)    Difficulty of Paying Living Expenses: Not hard at all  Food Insecurity: No Food Insecurity (04/17/2022)   Hunger Vital Sign    Worried About Running Out of Food in the Last Year: Never true    Ran Out of Food in the Last Year: Never true  Transportation Needs: No Transportation Needs (04/17/2022)    PRAPARE - Hydrologist (Medical): No    Lack of Transportation (Non-Medical): No  Physical Activity: Sufficiently Active (04/17/2022)   Exercise Vital Sign    Days of Exercise per Week: 7 days    Minutes of Exercise per Session: 150+ min  Stress: No Stress Concern Present (04/17/2022)   Gage    Feeling of Stress : Not at all  Social Connections: Not on file    Tobacco Counseling Counseling given: Not Answered Tobacco comments: maybe 1 cigar per day   Clinical Intake:  Pre-visit preparation completed: Yes  Pain : No/denies pain     Nutritional Status: BMI of 19-24  Normal Nutritional Risks: None Diabetes: No  How often do you need to have someone help you when you read instructions, pamphlets, or other written materials from your doctor or pharmacy?: 1 - Never  Diabetic? no  Interpreter Needed?: No  Information entered by :: NAllen LPN   Activities of Daily Living    04/17/2022   11:52 AM  In your present state of health, do you have any difficulty performing the following activities:  Hearing? 0  Vision? 1  Comment a little bit  Difficulty concentrating or making decisions? 0  Walking or climbing stairs? 0  Dressing or bathing? 0  Doing errands, shopping? 0  Preparing Food and eating ? N  Using the Toilet? N  In the past six months, have you accidently leaked urine? N  Do you have problems with loss of bowel control? N  Managing your Medications? N  Managing your Finances? N  Housekeeping or managing your Housekeeping? N    Patient Care Team: Minette Brine, FNP as PCP - General (General Practice)  Indicate any recent Medical Services you may have received from other than [facility] providers in the past year (date may be approximate).     Assessment:   This is a routine wellness examination for Accident.  Hearing/Vision screen Vision Screening - Comments:: No  regular eye exams, [facility]  Dietary issues and exercise activities discussed: Current Exercise Habits: Home exercise routine, Type of exercise: walking, Time (Minutes): > 60, Frequency (Times/Week): 7, Weekly Exercise (Minutes/Week): 0   Goals Addressed             This Visit's Progress    Patient Stated       04/17/2022, no goals       Depression Screen    04/17/2022   11:52 AM 03/28/2021   10:07 AM 03/19/2021    2:09 PM  PHQ 2/9 Scores  PHQ - 2 Score 0 0 0  PHQ- 9 Score   0    Fall Risk    04/17/2022   11:51 AM 03/28/2021   10:06 AM 03/19/2021    2:10 PM  Fall Risk  Falls in the past year? 0 0 0  Number falls in past yr: 0  0  Injury with Fall? 0  0  Risk for fall due to : Medication side effect No Fall Risks   Follow up Falls prevention discussed;Education provided;Falls evaluation completed Falls evaluation completed;Education provided;Falls prevention discussed     FALL RISK PREVENTION PERTAINING TO THE HOME:  Any stairs in or around the home? No  If so, are there any without handrails? N/a Home free of loose throw rugs in walkways, pet beds, electrical cords, etc? Yes  Adequate lighting in your home to reduce risk of falls? Yes   ASSISTIVE DEVICES UTILIZED TO PREVENT FALLS:  Life alert? No  Use of a cane, walker or w/c? No  Grab bars in the bathroom? Yes  Shower chair or bench in shower? Yes  Elevated toilet seat or a handicapped toilet? Yes   TIMED UP AND GO:  Was the test performed? No .      Cognitive Function:        04/17/2022   11:53 AM 03/28/2021   10:09 AM  6CIT Screen  What Year? 0 points 4 points  What month? 0 points 0 points  What time? 0 points 0 points  Count back from 20 0 points 0 points  Months in reverse 4 points 4 points  Repeat phrase 6 points 8 points  Total Score 10 points 16 points    Immunizations Immunization History  Administered Date(s) Administered   PFIZER(Purple Top)SARS-COV-2 Vaccination 01/25/2020,  02/15/2020    TDAP status: Due, Education has been provided regarding the importance of this vaccine. Advised may receive this vaccine at local pharmacy or Health Dept. Aware to provide a copy of the vaccination record if obtained from local pharmacy or Health Dept. Verbalized acceptance and understanding.  Flu Vaccine status: Declined, Education has been provided regarding the importance of this vaccine but patient still declined. Advised may receive this vaccine at local pharmacy or Health Dept. Aware to provide a copy of the vaccination record if obtained from local pharmacy or Health Dept. Verbalized acceptance and understanding.  Pneumococcal vaccine status: Declined,  Education has been provided regarding the importance of this vaccine but patient still declined. Advised may receive this vaccine at local pharmacy or Health Dept. Aware to provide a copy of the vaccination record if obtained from local pharmacy or Health Dept. Verbalized acceptance and understanding.   Covid-19 vaccine status: Completed vaccines  Qualifies for Shingles Vaccine? Yes   Zostavax completed No   Shingrix Completed?: No.    Education has been provided regarding the importance of this vaccine. Patient has been advised to call insurance company to determine out of pocket expense if they have not yet received this vaccine. Advised may also receive vaccine at local pharmacy or Health Dept. Verbalized acceptance and understanding.  Screening Tests Health Maintenance  Topic Date Due   DTaP/Tdap/Td (1 - Tdap) Never done   COLONOSCOPY (Pts 45-68yrs Insurance coverage will need to be confirmed)  Never done   Medicare Annual Wellness (AWV)  03/28/2022   COVID-19 Vaccine (3 - 2023-24 season) 05/03/2022 (Originally 12/13/2021)   INFLUENZA VACCINE  07/13/2022 (Originally 11/12/2021)   Zoster Vaccines- Shingrix (1 of 2) 07/17/2022 (Originally 03/25/2002)   Pneumonia Vaccine 40+ Years old (1 - PCV) 04/18/2023 (Originally  03/25/2017)   Hepatitis C Screening  Completed   HPV VACCINES  Aged Out    Health Maintenance  Health Maintenance Due  Topic Date Due  DTaP/Tdap/Td (1 - Tdap) Never done   COLONOSCOPY (Pts 45-30yrs Insurance coverage will need to be confirmed)  Never done   Medicare Annual Wellness (AWV)  03/28/2022    Colorectal cancer screening: Type of screening: Colonoscopy. Completed 2018. Repeat every 10 years  Lung Cancer Screening: (Low Dose CT Chest recommended if Age 60-80 years, 30 pack-year currently smoking OR have quit w/in 15years.) does not qualify.   Lung Cancer Screening Referral: no  Additional Screening:  Hepatitis C Screening: does qualify; Completed 06/25/2021  Vision Screening: Recommended annual ophthalmology exams for early detection of glaucoma and other disorders of the eye. Is the patient up to date with their annual eye exam?  No  Who is the provider or what is the name of the office in which the patient attends annual eye exams? [facility] If pt is not established with a provider, would they like to be referred to a provider to establish care? No .   Dental Screening: Recommended annual dental exams for proper oral hygiene  Community Resource Referral / Chronic Care Management: CRR required this visit?  No   CCM required this visit?  No      Plan:     I have personally reviewed and noted the following in the patient's chart:   Medical and social history Use of alcohol, tobacco or illicit drugs  Current medications and supplements including opioid prescriptions. Patient is not currently taking opioid prescriptions. Functional ability and status Nutritional status Physical activity Advanced directives List of other physicians Hospitalizations, surgeries, and ER visits in previous 12 months Vitals Screenings to include cognitive, depression, and falls Referrals and appointments  In addition, I have reviewed and discussed with patient certain preventive  protocols, quality metrics, and best practice recommendations. A written personalized care plan for preventive services as well as general preventive health recommendations were provided to patient.     Barb Merino, LPN   0/04/7508   Nurse Notes: none  Due to this being a virtual visit, the after visit summary with patients personalized plan was offered to patient via mail or my-chart.  to pick up at office at next visit

## 2022-06-30 ENCOUNTER — Encounter: Payer: Medicare Other | Admitting: Nurse Practitioner

## 2022-09-24 ENCOUNTER — Encounter: Payer: Self-pay | Admitting: Family Medicine

## 2022-09-24 ENCOUNTER — Ambulatory Visit (INDEPENDENT_AMBULATORY_CARE_PROVIDER_SITE_OTHER): Payer: Medicare Other | Admitting: Family Medicine

## 2022-09-24 VITALS — BP 118/70 | HR 78 | Temp 98.4°F | Ht 66.6 in | Wt 137.4 lb

## 2022-09-24 DIAGNOSIS — E559 Vitamin D deficiency, unspecified: Secondary | ICD-10-CM | POA: Diagnosis not present

## 2022-09-24 DIAGNOSIS — M79604 Pain in right leg: Secondary | ICD-10-CM | POA: Diagnosis not present

## 2022-09-24 DIAGNOSIS — R350 Frequency of micturition: Secondary | ICD-10-CM

## 2022-09-24 DIAGNOSIS — N401 Enlarged prostate with lower urinary tract symptoms: Secondary | ICD-10-CM | POA: Diagnosis not present

## 2022-09-24 MED ORDER — TAMSULOSIN HCL 0.4 MG PO CAPS
0.4000 mg | ORAL_CAPSULE | Freq: Every day | ORAL | 0 refills | Status: DC
Start: 2022-09-24 — End: 2022-12-22

## 2022-09-24 MED ORDER — VITAMIN D (ERGOCALCIFEROL) 1.25 MG (50000 UNIT) PO CAPS
50000.0000 [IU] | ORAL_CAPSULE | ORAL | 0 refills | Status: DC
Start: 2022-09-24 — End: 2022-12-30

## 2022-09-24 NOTE — Progress Notes (Signed)
I,Jameka J Llittleton,acting as a Neurosurgeon for [employer], NP.,have documented all relevant documentation on the behalf of Lamiyah Schlotter, NP,as directed by  Irby Fails Moshe Salisbury, NP while in the presence of Zaylie Gisler, NP.   Subjective:  Patient ID: Aaron Mejia , male    DOB: 19-Jun-1950 , 72 y.o.   MRN: [id number]  Chief Complaint  Patient presents with   Medication Refill   Leg Pain    HPI  Patient presents today for pain in his right calf and leg that has been ongoing for two months, patient states it is worse when he takes a walk and gets better with rest . He reports he has been walking daily for 3 years and has never had this kind of pain before. He states that when he starts walking and the pain comes , his wife has to almost carry him back home. He needs a refill of his flomax.   Leg Pain  There was no injury mechanism. The pain is present in the right leg. The quality of the pain is described as aching. The pain is at a severity of 9/10. The pain is severe. The pain has been Worsening since onset. Associated symptoms include an inability to bear weight. Associated symptoms comments: Pain worsens with walking. He reports no foreign bodies present. The symptoms are aggravated by movement. He has tried ice, rest and acetaminophen for the symptoms. The treatment provided no relief.     Past Medical History:  Diagnosis Date   Anemia    Arthritis    GERD (gastroesophageal reflux disease)      Family History  Problem Relation Age of Onset   Hypertension Mother    Hypertension Father    Kidney disease Father      Current Outpatient Medications:    tamsulosin (FLOMAX) 0.4 MG CAPS capsule, Take 1 capsule (0.4 mg total) by mouth daily after supper., Disp: 90 capsule, Rfl: 0   Vitamin D, Ergocalciferol, (DRISDOL) 1.25 MG (50000 UNIT) CAPS capsule, Take 1 capsule (50,000 Units total) by mouth every 7 (seven) days., Disp: 12 capsule, Rfl: 0   Allergies  Allergen Reactions   Penicillins  Swelling    Has never taken it was just told it makes him swell  Has patient had a PCN reaction causing immediate rash, facial/tongue/throat swelling, SOB or lightheadedness with hypotension: unknown Has patient had a PCN reaction causing severe rash involving mucus membranes or skin necrosis: unknown Has patient had a PCN reaction that required hospitalization unknown Has patient had a PCN reaction occurring within the last 10 years: No If all of the above answers are "NO", then may proceed with Cephalosporin     Review of Systems  Constitutional: Negative.   Eyes: Negative.   Cardiovascular:  Positive for leg swelling (pain).       Calf pain with walking  Genitourinary:  Positive for frequency.  Musculoskeletal: Negative.   Skin: Negative.   Psychiatric/Behavioral: Negative.       Today's Vitals   09/24/22 0955  BP: 118/70  Pulse: 78  Temp: 98.4 F (36.9 C)  Weight: 137 lb 6.4 oz (62.3 kg)  Height: 5' 6.6" (1.692 m)  PainSc: 9   PainLoc: Leg   Body mass index is 21.78 kg/m.  Wt Readings from Last 3 Encounters:  09/24/22 137 lb 6.4 oz (62.3 kg)  04/17/22 145 lb (65.8 kg)  12/25/21 140 lb (63.5 kg)     Objective:  Physical Exam Constitutional:  Appearance: Normal appearance.  Cardiovascular:     Rate and Rhythm: Normal rate and regular rhythm.  Pulmonary:     Effort: Pulmonary effort is normal. No respiratory distress.  Musculoskeletal:        General: Tenderness present. Normal range of motion.  Skin:    General: Skin is warm and dry.  Neurological:     General: No focal deficit present.     Mental Status: He is alert and oriented to person, place, and time. Mental status is at baseline.  Psychiatric:        Mood and Affect: Mood normal.         Assessment And Plan:  Leg pain, posterior, right -     Ambulatory referral to Vascular Surgery -     BMP8+eGFR -     CBC  Benign prostatic hyperplasia with urinary frequency -     Tamsulosin HCl; Take 1  capsule (0.4 mg total) by mouth daily after supper.  Dispense: 90 capsule; Refill: 0  Vitamin D deficiency -     VITAMIN D 25 Hydroxy (Vit-D Deficiency, Fractures) -     Vitamin D (Ergocalciferol); Take 1 capsule (50,000 Units total) by mouth every 7 (seven) days.  Dispense: 12 capsule; Refill: 0     Return in about 4 months (around 01/24/2023) for physical.  Patient was given opportunity to ask questions. Patient verbalized understanding of the plan and was able to repeat key elements of the plan. All questions were answered to their satisfaction.  Adiyah Lame Moshe Salisbury, NP  I, Irini Leet Moshe Salisbury, NP, have reviewed all documentation for this visit. The documentation on 09/25/22 for the exam, diagnosis, procedures, and orders are all accurate and complete.   IF YOU HAVE BEEN REFERRED TO A SPECIALIST, IT MAY TAKE 1-2 WEEKS TO SCHEDULE/PROCESS THE REFERRAL. IF YOU HAVE NOT HEARD FROM US/SPECIALIST IN TWO WEEKS, PLEASE GIVE [place] A CALL AT [phone].   THE PATIENT IS ENCOURAGED TO PRACTICE SOCIAL DISTANCING DUE TO THE COVID-19 PANDEMIC.

## 2022-09-25 LAB — CBC
Hematocrit: 39.3 % (ref 37.5–51.0)
Hemoglobin: 12.4 g/dL — ABNORMAL LOW (ref 13.0–17.7)
MCH: 26.5 pg — ABNORMAL LOW (ref 26.6–33.0)
MCHC: 31.6 g/dL (ref 31.5–35.7)
MCV: 84 fL (ref 79–97)
Platelets: 272 10*3/uL (ref 150–450)
RBC: 4.68 x10E6/uL (ref 4.14–5.80)
RDW: 14.2 % (ref 11.6–15.4)
WBC: 4.9 10*3/uL (ref 3.4–10.8)

## 2022-09-25 LAB — BMP8+EGFR
BUN/Creatinine Ratio: 13 (ref 10–24)
BUN: 15 mg/dL (ref 8–27)
CO2: 22 mmol/L (ref 20–29)
Calcium: 9.3 mg/dL (ref 8.6–10.2)
Chloride: 106 mmol/L (ref 96–106)
Creatinine, Ser: 1.19 mg/dL (ref 0.76–1.27)
Glucose: 98 mg/dL (ref 70–99)
Potassium: 4.6 mmol/L (ref 3.5–5.2)
Sodium: 140 mmol/L (ref 134–144)
eGFR: 66 mL/min/{1.73_m2} (ref >59)

## 2022-09-25 LAB — VITAMIN D 25 HYDROXY (VIT D DEFICIENCY, FRACTURES): Vit D, 25-Hydroxy: 72 ng/mL (ref 30.0–100.0)

## 2022-10-21 ENCOUNTER — Other Ambulatory Visit: Payer: Self-pay | Admitting: *Deleted

## 2022-10-21 DIAGNOSIS — M79604 Pain in right leg: Secondary | ICD-10-CM

## 2022-11-06 ENCOUNTER — Ambulatory Visit (INDEPENDENT_AMBULATORY_CARE_PROVIDER_SITE_OTHER): Payer: Medicare Other | Admitting: Vascular Surgery

## 2022-11-06 ENCOUNTER — Ambulatory Visit (HOSPITAL_COMMUNITY)
Admission: RE | Admit: 2022-11-06 | Discharge: 2022-11-06 | Disposition: A | Discharge status: Home / Self Care | Payer: Medicare Other | Source: Ambulatory Visit | Attending: Vascular Surgery | Admitting: Vascular Surgery

## 2022-11-06 ENCOUNTER — Encounter: Payer: Self-pay | Admitting: Vascular Surgery

## 2022-11-06 VITALS — BP 166/84 | HR 62 | Temp 98.4°F | Resp 20 | Ht 66.0 in | Wt 139.8 lb

## 2022-11-06 DIAGNOSIS — M79604 Pain in right leg: Secondary | ICD-10-CM

## 2022-11-06 DIAGNOSIS — I70219 Atherosclerosis of native arteries of extremities with intermittent claudication, unspecified extremity: Secondary | ICD-10-CM | POA: Diagnosis not present

## 2022-11-06 LAB — VAS US ABI WITH/WO TBI
Left ABI: 1.03
Right ABI: 0.68

## 2022-11-06 NOTE — Progress Notes (Signed)
ASSESSMENT & PLAN   PERIPHERAL ARTERIAL DISEASE WITH CLAUDICATION: This patient has stable right calf claudication which she has had since the spring.  He denies any history of rest pain or nonhealing ulcers.  He is not a smoker.  I have encouraged him to get on a structured walking program.  We have also discussed the importance of nutrition.  I explained that we typically would not consider arteriography and potential revascularization unless he developed disabling claudication, rest pain, or nonhealing ulcer.  I explained that I will be retiring.  I have ordered follow-up ABIs in 1 year and he will be seen on the PA schedule at that time.  He knows to call sooner if he has problems.  REASON FOR CONSULT:    Right calf claudication.  The consult is requested by Ellender Hose, NP.  HPI:   Aaron Mejia is a 72 y.o. male who was referred with right calf claudication.  I have reviewed the records from the referring office.  The patient was seen on 09/24/2022.  He has been having some right calf pain which had been going on for 2 months.  It was brought on by ambulation and relieved with rest.  This was relatively new as it been walking daily for many years without any pain.  On my history, in the spring the patient noted the gradual onset of right calf claudication.  He states that he can walk about the distance of "3 houses" before experiencing right calf claudication.  He has no symptoms on the left side.  He denies thigh or hip claudication.  The symptoms are relieved with rest.  He denies any history of rest pain or nonhealing wounds.  He has no real risk factors for peripheral arterial disease.  He denies any history of diabetes, hypertension, hypercholesterolemia, family history of premature cardiovascular disease, or tobacco use.  Past Medical History:  Diagnosis Date   Anemia    Arthritis    GERD (gastroesophageal reflux disease)     Family History  Problem Relation Age of Onset    Hypertension Mother    Hypertension Father    Kidney disease Father     SOCIAL HISTORY: Social History   Tobacco Use   Smoking status: Former   Smokeless tobacco: Never   Tobacco comments:    maybe 1 cigar per day  Substance Use Topics   Alcohol use: Yes    Alcohol/week: 6.0 standard drinks of alcohol    Types: 6 Cans of beer per week    Allergies  Allergen Reactions   Penicillins Swelling    Has never taken it was just told it makes him swell  Has patient had a PCN reaction causing immediate rash, facial/tongue/throat swelling, SOB or lightheadedness with hypotension: unknown Has patient had a PCN reaction causing severe rash involving mucus membranes or skin necrosis: unknown Has patient had a PCN reaction that required hospitalization unknown Has patient had a PCN reaction occurring within the last 10 years: No If all of the above answers are "NO", then may proceed with Cephalosporin    Current Outpatient Medications  Medication Sig Dispense Refill   tamsulosin (FLOMAX) 0.4 MG CAPS capsule Take 1 capsule (0.4 mg total) by mouth daily after supper. 90 capsule 0   Vitamin D, Ergocalciferol, (DRISDOL) 1.25 MG (50000 UNIT) CAPS capsule Take 1 capsule (50,000 Units total) by mouth every 7 (seven) days. 12 capsule 0   No current facility-administered medications for this visit.  REVIEW OF SYSTEMS:  [X]  denotes positive finding, [ ]  denotes negative finding Cardiac  Comments:  Chest pain or chest pressure:    Shortness of breath upon exertion:    Short of breath when lying flat:    Irregular heart rhythm:        Vascular    Pain in calf, thigh, or hip brought on by ambulation: x   Pain in feet at night that wakes you up from your sleep:     Blood clot in your veins:    Leg swelling:         Pulmonary    Oxygen at home:    Productive cough:     Wheezing:         Neurologic    Sudden weakness in arms or legs:     Sudden numbness in arms or legs:     Sudden  onset of difficulty speaking or slurred speech:    Temporary loss of vision in one eye:     Problems with dizziness:         Gastrointestinal    Blood in stool:     Vomited blood:         Genitourinary    Burning when urinating:     Blood in urine:        Psychiatric    Major depression:         Hematologic    Bleeding problems:    Problems with blood clotting too easily:        Skin    Rashes or ulcers:        Constitutional    Fever or chills:    -  PHYSICAL EXAM:   Vitals:   11/06/22 1429  BP: (!) 166/84  Pulse: 62  Resp: 20  Temp: 98.4 F (36.9 C)  SpO2: 98%  Weight: 139 lb 12.8 oz (63.4 kg)  Height: 5\' 6"  (1.676 m)   Body mass index is 22.56 kg/m.  GENERAL: The patient is a well-nourished male, in no acute distress. The vital signs are documented above. CARDIAC: There is a regular rate and rhythm.  VASCULAR: I do not detect carotid bruits. On the right side he has a palpable femoral pulse.  I cannot palpate a popliteal or pedal pulses. On the left side he has a palpable femoral, popliteal, and dorsalis pedis pulse. PULMONARY: There is good air exchange bilaterally without wheezing or rales. ABDOMEN: Soft and non-tender with normal pitched bowel sounds.  MUSCULOSKELETAL: There are no major deformities. NEUROLOGIC: No focal weakness or paresthesias are detected. SKIN: There are no ulcers or rashes noted. PSYCHIATRIC: The patient has a normal affect.  DATA:    ARTERIAL DOPPLER STUDY: I have independently interpreted his arterial Doppler study today.  On the right side he has a monophasic posterior tibial and dorsalis pedis signal.  ABIs 68%.  Toe pressure 68 mmHg.  On the left side he has a biphasic posterior tibial signal with a triphasic dorsalis pedis signal.  ABI is 100%.  Toe pressure is 119 mmHg.  Waverly Ferrari [facility]

## 2022-11-12 ENCOUNTER — Other Ambulatory Visit: Payer: Self-pay

## 2022-11-12 DIAGNOSIS — I70219 Atherosclerosis of native arteries of extremities with intermittent claudication, unspecified extremity: Secondary | ICD-10-CM

## 2022-12-21 ENCOUNTER — Other Ambulatory Visit: Payer: Self-pay | Admitting: Family Medicine

## 2022-12-21 DIAGNOSIS — N401 Enlarged prostate with lower urinary tract symptoms: Secondary | ICD-10-CM

## 2022-12-30 ENCOUNTER — Other Ambulatory Visit: Payer: Self-pay | Admitting: Family Medicine

## 2022-12-30 DIAGNOSIS — E559 Vitamin D deficiency, unspecified: Secondary | ICD-10-CM

## 2023-01-28 NOTE — Progress Notes (Deleted)
Aaron Mejia, CMA,acting as a Neurosurgeon for Aaron Felts, Aaron Mejia.,have documented all relevant documentation on the behalf of Aaron Felts, Aaron Mejia,as directed by  Aaron Felts, Aaron Mejia while in the presence of Aaron Felts, Aaron Mejia.  Subjective:    Patient ID: DEMARRIUS Mejia , male    DOB: 1951/03/10 , 72 y.o.   MRN: [id number]  No chief complaint on file.   HPI  Patient presents today for HM, Patient reports compliance with medication. Patient denies any chest pain, SOB, or headaches. Patient has no concerns today.     Past Medical History:  Diagnosis Date  . Anemia   . Arthritis   . GERD (gastroesophageal reflux disease)      Family History  Problem Relation Age of Onset  . Hypertension Mother   . Hypertension Father   . Kidney disease Father      Current Outpatient Medications:  .  tamsulosin (FLOMAX) 0.4 MG CAPS capsule, TAKE 1 CAPSULE(0.4 MG) BY MOUTH DAILY AFTER SUPPER, Disp: 90 capsule, Rfl: 0 .  Vitamin D, Ergocalciferol, (DRISDOL) 1.25 MG (50000 UNIT) CAPS capsule, TAKE 1 CAPSULE BY MOUTH EVERY 7 DAYS, Disp: 12 capsule, Rfl: 0   Allergies  Allergen Reactions  . Penicillins Swelling    Has never taken it was just told it makes him swell  Has patient had a PCN reaction causing immediate rash, facial/tongue/throat swelling, SOB or lightheadedness with hypotension: unknown Has patient had a PCN reaction causing severe rash involving mucus membranes or skin necrosis: unknown Has patient had a PCN reaction that required hospitalization unknown Has patient had a PCN reaction occurring within the last 10 years: No If all of the above answers are "NO", then may proceed with Cephalosporin      The patient states she uses {contraceptive methods:5051} for birth control. No LMP for male patient.. {Dysmenorrhea-menorrhagia:21918}. Negative for: breast discharge, breast lump(s), breast pain and breast self exam. Associated symptoms include abnormal vaginal bleeding. Pertinent negatives  include abnormal bleeding (hematology), anxiety, decreased libido, depression, difficulty falling sleep, dyspareunia, history of infertility, nocturia, sexual dysfunction, sleep disturbances, urinary incontinence, urinary urgency, vaginal discharge and vaginal itching. Diet regular.The patient states her exercise level is    . The patient's tobacco use is:  Social History   Tobacco Use  Smoking Status Former  Smokeless Tobacco Never  Tobacco Comments   maybe 1 cigar per day  . She has been exposed to passive smoke. The patient's alcohol use is:  Social History   Substance and Sexual Activity  Alcohol Use Yes  . Alcohol/week: 6.0 standard drinks of alcohol  . Types: 6 Cans of beer per week  . Additional information: Last pap ***, next one scheduled for ***.    Review of Systems   There were no vitals filed for this visit. There is no height or weight on file to calculate BMI.  Wt Readings from Last 3 Encounters:  11/06/22 139 lb 12.8 oz (63.4 kg)  09/24/22 137 lb 6.4 oz (62.3 kg)  04/17/22 145 lb (65.8 kg)     Objective:  Physical Exam      Assessment And Plan:     Encounter for annual health examination  Vitamin D deficiency     No follow-ups on file. Patient was given opportunity to ask questions. Patient verbalized understanding of the plan and was able to repeat key elements of the plan. All questions were answered to their satisfaction.   Aaron Felts, Aaron Mejia  I, Aaron Felts, Aaron Mejia, have reviewed all  documentation for this visit. The documentation on 01/28/23 for the exam, diagnosis, procedures, and orders are all accurate and complete.

## 2023-01-29 ENCOUNTER — Encounter: Payer: Medicare Other | Admitting: Nurse Practitioner

## 2023-01-29 DIAGNOSIS — Z0000 Encounter for general adult medical examination without abnormal findings: Secondary | ICD-10-CM

## 2023-01-29 DIAGNOSIS — E559 Vitamin D deficiency, unspecified: Secondary | ICD-10-CM

## 2023-02-05 ENCOUNTER — Other Ambulatory Visit: Payer: Self-pay | Admitting: Family Medicine

## 2023-02-05 DIAGNOSIS — E559 Vitamin D deficiency, unspecified: Secondary | ICD-10-CM

## 2023-04-24 ENCOUNTER — Other Ambulatory Visit: Payer: Self-pay | Admitting: Family Medicine

## 2023-04-24 DIAGNOSIS — N401 Enlarged prostate with lower urinary tract symptoms: Secondary | ICD-10-CM

## 2023-05-06 ENCOUNTER — Encounter: Payer: Self-pay | Admitting: Nurse Practitioner

## 2023-05-06 ENCOUNTER — Ambulatory Visit: Payer: Medicare Other

## 2023-05-06 ENCOUNTER — Ambulatory Visit: Payer: Medicare Other | Admitting: Nurse Practitioner

## 2023-05-06 VITALS — BP 128/70 | HR 87 | Temp 98.4°F | Ht 67.0 in | Wt 141.6 lb

## 2023-05-06 VITALS — BP 128/70 | HR 87 | Temp 98.4°F | Ht 67.0 in | Wt 141.0 lb

## 2023-05-06 DIAGNOSIS — R030 Elevated blood-pressure reading, without diagnosis of hypertension: Secondary | ICD-10-CM | POA: Diagnosis not present

## 2023-05-06 DIAGNOSIS — R202 Paresthesia of skin: Secondary | ICD-10-CM

## 2023-05-06 DIAGNOSIS — E559 Vitamin D deficiency, unspecified: Secondary | ICD-10-CM | POA: Diagnosis not present

## 2023-05-06 DIAGNOSIS — Z0000 Encounter for general adult medical examination without abnormal findings: Secondary | ICD-10-CM

## 2023-05-06 MED ORDER — MAGNESIUM GLYCINATE 120 MG PO CAPS: 2.0000 | ORAL_CAPSULE | Freq: Every day | ORAL | 2 refills | Status: DC

## 2023-05-06 MED ORDER — VITAMIN D (ERGOCALCIFEROL) 1.25 MG (50000 UNIT) PO CAPS
50000.0000 [IU] | ORAL_CAPSULE | ORAL | 0 refills | Status: DC
Start: 2023-05-06 — End: 2023-11-05

## 2023-05-06 NOTE — Patient Instructions (Signed)
Mr. Kosier , Thank you for taking time to come for your Medicare Wellness Visit. I appreciate your ongoing commitment to your health goals. Please review the following plan we discussed and let me know if I can assist you in the future.   Referrals/Orders/Follow-Ups/Clinician Recommendations: none  This is a list of the screening recommended for you and due dates:  Health Maintenance  Topic Date Due   Colon Cancer Screening  Never done   COVID-19 Vaccine (3 - 2024-25 season) 05/22/2023*   Flu Shot  07/13/2023*   Zoster (Shingles) Vaccine (1 of 2) 08/04/2023*   DTaP/Tdap/Td vaccine (1 - Tdap) 05/05/2024*   Pneumonia Vaccine (1 of 1 - PCV) 05/05/2024*   Medicare Annual Wellness Visit  05/05/2024   Hepatitis C Screening  Completed   HPV Vaccine  Aged Out  *Topic was postponed. The date shown is not the original due date.    Advanced directives: (Provided) Advance directive discussed with you today. I have provided a copy for you to complete at home and have notarized. Once this is complete, please bring a copy in to our office so we can scan it into your chart.   Next Medicare Annual Wellness Visit scheduled for next year: No, office will schedule  insert Preventive Care attachment Insert FALL PREVENTION attachment if needed

## 2023-05-06 NOTE — Patient Instructions (Signed)
Get wrist splints at night to wear and see if it helps your numbness and tingling. Let us know if this is not effective so we can refer you to a hand specialist.

## 2023-05-06 NOTE — Progress Notes (Signed)
Madelaine Bhat, CMA,acting as a Neurosurgeon for Arnette Felts, FNP.,have documented all relevant documentation on the behalf of Arnette Felts, FNP,as directed by  Arnette Felts, FNP while in the presence of Arnette Felts, FNP.  Subjective:  Patient ID: Aaron Mejia , male    DOB: 1950/05/29 , 73 y.o.   MRN: [id number]  No chief complaint on file.   HPI  Patient presents today for a bp follow up, Patient reports compliance with medication. Patient denies any chest pain, SOB, or headaches. Patient has no concerns today. He participates in a [employer].      Past Medical History:  Diagnosis Date   Anemia    Arthritis    GERD (gastroesophageal reflux disease)      Family History  Problem Relation Age of Onset   Hypertension Mother    Hypertension Father    Kidney disease Father     Current Outpatient Medications:    Magnesium Glycinate 120 MG CAPS, Take 2 capsules by mouth daily., Disp: 180 capsule, Rfl: 2   tamsulosin (FLOMAX) 0.4 MG CAPS capsule, TAKE 1 CAPSULE(0.4 MG) BY MOUTH DAILY AFTER SUPPER, Disp: 90 capsule, Rfl: 0   Vitamin D, Ergocalciferol, (DRISDOL) 1.25 MG (50000 UNIT) CAPS capsule, Take 1 capsule (50,000 Units total) by mouth every 7 (seven) days., Disp: 12 capsule, Rfl: 0   Allergies  Allergen Reactions   Penicillins Swelling    Has never taken it was just told it makes him swell  Has patient had a PCN reaction causing immediate rash, facial/tongue/throat swelling, SOB or lightheadedness with hypotension: unknown Has patient had a PCN reaction causing severe rash involving mucus membranes or skin necrosis: unknown Has patient had a PCN reaction that required hospitalization unknown Has patient had a PCN reaction occurring within the last 10 years: No If all of the above answers are "NO", then may proceed with Cephalosporin     Review of Systems  Constitutional: Negative.   HENT: Negative.    Eyes: Negative.   Respiratory: Negative.  Negative for apnea and cough.    Cardiovascular: Negative.   Genitourinary: Negative.   Skin: Negative.   Allergic/Immunologic: Negative.   Neurological:  Negative for dizziness.  Hematological: Negative.   Psychiatric/Behavioral: Negative.       Today's Vitals   05/06/23 1021  BP: 128/70  Pulse: 87  Temp: 98.4 F (36.9 C)  TempSrc: Oral  Weight: 141 lb (64 kg)  Height: 5\' 7"  (1.702 m)  PainSc: 0-No pain   Body mass index is 22.08 kg/m.  Wt Readings from Last 3 Encounters:  05/06/23 141 lb (64 kg)  05/06/23 141 lb 9.6 oz (64.2 kg)  11/06/22 139 lb 12.8 oz (63.4 kg)    The 10-year ASCVD risk score (Arnett DK, et al., 2019) is: 11.9%   Values used to calculate the score:     Age: 19 years     Sex: Male     Is Non-Hispanic African American: Yes     Diabetic: No     Tobacco smoker: No     Systolic Blood Pressure: 128 mmHg     Is BP treated: No     HDL Cholesterol: 52 mg/dL     Total Cholesterol: 194 mg/dL  Objective:  Physical Exam Vitals reviewed.  Constitutional:      General: He is not in acute distress.    Appearance: Normal appearance.  Cardiovascular:     Rate and Rhythm: Normal rate and regular rhythm.  Pulses: Normal pulses.     Heart sounds: Normal heart sounds. No murmur heard. Pulmonary:     Effort: Pulmonary effort is normal. No respiratory distress.     Breath sounds: Normal breath sounds. No wheezing.  Skin:    General: Skin is warm.     Capillary Refill: Capillary refill takes less than 2 seconds.  Neurological:     General: No focal deficit present.     Mental Status: He is alert and oriented to person, place, and time.     Cranial Nerves: No cranial nerve deficit.     Motor: No weakness.  Psychiatric:        Mood and Affect: Mood normal.        Behavior: Behavior normal.        Thought Content: Thought content normal.        Judgment: Judgment normal.         Assessment And Plan:  Elevated systolic blood pressure reading without diagnosis of  hypertension Assessment & Plan: Blood pressure has been slightly elevated, repeat is normal. He will start magnesium supplement. Discussed importance of limiting intake of high salt foods  Orders: -     Magnesium Glycinate; Take 2 capsules by mouth daily.  Dispense: 180 capsule; Refill: 2  Vitamin D deficiency Assessment & Plan: Will check vitamin D level and supplement as needed.    Also encouraged to spend 15 minutes in the sun daily.    Orders: -     Vitamin D (Ergocalciferol); Take 1 capsule (50,000 Units total) by mouth every 7 (seven) days.  Dispense: 12 capsule; Refill: 0  Tingling of both upper extremities Assessment & Plan: Will check for metabolic cause  Orders: -     TSH -     Vitamin B12    Return in about 6 months (around 11/03/2023) for HM.  Patient was given opportunity to ask questions. Patient verbalized understanding of the plan and was able to repeat key elements of the plan. All questions were answered to their satisfaction.     Jeanell Sparrow, FNP, have reviewed all documentation for this visit. The documentation on 05/06/23 for the exam, diagnosis, procedures, and orders are all accurate and complete.  IF YOU HAVE BEEN REFERRED TO A SPECIALIST, IT MAY TAKE 1-2 WEEKS TO SCHEDULE/PROCESS THE REFERRAL. IF YOU HAVE NOT HEARD FROM US/SPECIALIST IN TWO WEEKS, PLEASE GIVE [place] A CALL AT [phone].

## 2023-05-06 NOTE — Progress Notes (Signed)
Subjective:   Aaron Mejia is a 73 y.o. male who presents for Medicare Annual/Subsequent preventive examination.  Visit Complete: In person    Cardiac Risk Factors include: advanced age (>3men, >75 women);male gender     Objective:    Today's Vitals   05/06/23 0955 05/06/23 1019  BP: (!) 150/60 128/70  Pulse: 87   Temp: 98.4 F (36.9 C)   TempSrc: Oral   SpO2: 98%   Weight: 141 lb 9.6 oz (64.2 kg)   Height: 5\' 7"  (1.702 m)    Body mass index is 22.18 kg/m.     05/06/2023   10:05 AM 04/17/2022   11:51 AM 03/28/2021   10:05 AM 02/23/2020    8:12 AM 09/06/2016    8:00 AM 08/25/2016    9:57 AM  Advanced Directives  Does Patient Have a Medical Advance Directive? No No No No No No  Does patient want to make changes to medical advance directive?    Yes (MAU/Ambulatory/Procedural Areas - Information given)    Would patient like information on creating a medical advance directive? Yes (MAU/Ambulatory/Procedural Areas - Information given)  No - Patient declined No - Patient declined No - Patient declined Yes (MAU/Ambulatory/Procedural Areas - Information given)    Current Medications (verified) Outpatient Encounter Medications as of 05/06/2023  Medication Sig   tamsulosin (FLOMAX) 0.4 MG CAPS capsule TAKE 1 CAPSULE(0.4 MG) BY MOUTH DAILY AFTER SUPPER   Vitamin D, Ergocalciferol, (DRISDOL) 1.25 MG (50000 UNIT) CAPS capsule TAKE 1 CAPSULE BY MOUTH EVERY 7 DAYS   No facility-administered encounter medications on file as of 05/06/2023.    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   Procedure: TOTAL HIP ARTHROPLASTY;  Surgeon: Frederico Hamman, MD;  Location: [facility] OR;  Service: Orthopedics;  Laterality: Left;   Family History  Problem Relation Age of Onset   Hypertension  Mother    Hypertension Father    Kidney disease Father    Social History   Socioeconomic History   Marital status: Single    Spouse name: Not on file   Number of children: Not on file   Years of education: Not on file   Highest education level: Not on file  Occupational History   Not on file  Tobacco Use   Smoking status: Former   Smokeless tobacco: Never   Tobacco comments:    maybe 1 cigar per day  Vaping Use   Vaping status: Never Used  Substance and Sexual Activity   Alcohol use: Yes    Alcohol/week: 6.0 standard drinks of alcohol    Types: 6 Cans of beer per week   Drug use: No   Sexual activity: Yes  Other Topics Concern   Not on file  Social History Narrative   Not on file   Social Drivers of Health   Financial Resource Strain: Low Risk  (05/06/2023)   Overall Financial Resource Strain (CARDIA)    Difficulty of Paying Living Expenses: Not hard at all  Food Insecurity: No Food Insecurity (05/06/2023)   Hunger Vital Sign    Worried About Running Out of Food in the Last Year: Never true    Ran Out of Food in the Last Year: Never true  Transportation Needs: No Transportation Needs (05/06/2023)   PRAPARE - Transportation  Lack of Transportation (Medical): No    Lack of Transportation (Non-Medical): No  Physical Activity: Inactive (05/06/2023)   Exercise Vital Sign    Days of Exercise per Week: 0 days    Minutes of Exercise per Session: 0 min  Stress: No Stress Concern Present (05/06/2023)   [employer] of Occupational Health - Occupational Stress Questionnaire    Feeling of Stress : Not at all  Social Connections: Socially Integrated (05/06/2023)   Social Connection and Isolation Panel [NHANES]    Frequency of Communication with Friends and Family: Once a week    Frequency of Social Gatherings with Friends and Family: More than three times a week    Attends Religious Services: More than 4 times per year    Active Member of [employer] or Organizations: Yes     Attends Engineer, structural: More than 4 times per year    Marital Status: Married    Tobacco Counseling Counseling given: Not Answered Tobacco comments: maybe 1 cigar per day   Clinical Intake:  Pre-visit preparation completed: Yes  Pain : No/denies pain     Nutritional Status: BMI of 19-24  Normal Nutritional Risks: None Diabetes: No  How often do you need to have someone help you when you read instructions, pamphlets, or other written materials from your doctor or pharmacy?: 1 - Never  Interpreter Needed?: No  Information entered by :: NAllen LPN   Activities of Daily Living    05/06/2023    9:57 AM 09/24/2022    9:53 AM  In your present state of health, do you have any difficulty performing the following activities:  Hearing? 0 0  Vision? 1 0  Comment little blurry left eye   Difficulty concentrating or making decisions? 0 1  Walking or climbing stairs? 1 1  Comment due to hip   Dressing or bathing? 0 0  Doing errands, shopping? 0 0  Preparing Food and eating ? N   Using the Toilet? N   In the past six months, have you accidently leaked urine? N   Do you have problems with loss of bowel control? N   Managing your Medications? N   Managing your Finances? N   Housekeeping or managing your Housekeeping? N     Patient Care Team: Arnette Felts, FNP as PCP - General (General Practice)  Indicate any recent Medical Services you may have received from other than [facility] providers in the past year (date may be approximate).     Assessment:   This is a routine wellness examination for [place].  Hearing/Vision screen Hearing Screening - Comments:: Denies hearing issues Vision Screening - Comments:: Regular Eye exams, [facility]   Goals Addressed             This Visit's Progress    Patient Stated       05/06/2023, stay healthy       Depression Screen    05/06/2023   10:07 AM 09/24/2022    9:52 AM 04/17/2022   11:52 AM 03/28/2021   10:07  AM 03/19/2021    2:09 PM  PHQ 2/9 Scores  PHQ - 2 Score 0 0 0 0 0  PHQ- 9 Score  0   0    Fall Risk    05/06/2023   10:06 AM 09/24/2022    9:52 AM 04/17/2022   11:51 AM 03/28/2021   10:06 AM 03/19/2021    2:10 PM  Fall Risk   Falls in the past year?  1 0 0 0 0  Comment fell backwards cutting wood      Number falls in past yr: 0 0 0  0  Injury with Fall? 0 0 0  0  Risk for fall due to : Medication side effect No Fall Risks Medication side effect No Fall Risks   Follow up Falls prevention discussed;Falls evaluation completed Falls evaluation completed Falls prevention discussed;Education provided;Falls evaluation completed Falls evaluation completed;Education provided;Falls prevention discussed     MEDICARE RISK AT HOME: Medicare Risk at Home Any stairs in or around the home?: Yes If so, are there any without handrails?: No Home free of loose throw rugs in walkways, pet beds, electrical cords, etc?: Yes Adequate lighting in your home to reduce risk of falls?: Yes Life alert?: No Use of a cane, walker or w/c?: No Grab bars in the bathroom?: Yes Shower chair or bench in shower?: No Elevated toilet seat or a handicapped toilet?: Yes  TIMED UP AND GO:  Was the test performed?  Yes  Length of time to ambulate 10 feet: 5 sec Gait steady and fast without use of assistive device    Cognitive Function:        05/06/2023   10:08 AM 04/17/2022   11:53 AM 03/28/2021   10:09 AM  6CIT Screen  What Year? 0 points 0 points 4 points  What month? 0 points 0 points 0 points  What time? 0 points 0 points 0 points  Count back from 20 0 points 0 points 0 points  Months in reverse 4 points 4 points 4 points  Repeat phrase 2 points 6 points 8 points  Total Score 6 points 10 points 16 points    Immunizations Immunization History  Administered Date(s) Administered   PFIZER(Purple Top)SARS-COV-2 Vaccination 01/25/2020, 02/15/2020    TDAP status: Due, Education has been provided regarding  the importance of this vaccine. Advised may receive this vaccine at local pharmacy or Health Dept. Aware to provide a copy of the vaccination record if obtained from local pharmacy or Health Dept. Verbalized acceptance and understanding.  Flu Vaccine status: Declined, Education has been provided regarding the importance of this vaccine but patient still declined. Advised may receive this vaccine at local pharmacy or Health Dept. Aware to provide a copy of the vaccination record if obtained from local pharmacy or Health Dept. Verbalized acceptance and understanding.  Pneumococcal vaccine status: Declined,  Education has been provided regarding the importance of this vaccine but patient still declined. Advised may receive this vaccine at local pharmacy or Health Dept. Aware to provide a copy of the vaccination record if obtained from local pharmacy or Health Dept. Verbalized acceptance and understanding.   Covid-19 vaccine status: Declined, Education has been provided regarding the importance of this vaccine but patient still declined. Advised may receive this vaccine at local pharmacy or Health Dept.or vaccine clinic. Aware to provide a copy of the vaccination record if obtained from local pharmacy or Health Dept. Verbalized acceptance and understanding.  Qualifies for Shingles Vaccine? Yes   Zostavax completed No   Shingrix Completed?: No.    Education has been provided regarding the importance of this vaccine. Patient has been advised to call insurance company to determine out of pocket expense if they have not yet received this vaccine. Advised may also receive vaccine at local pharmacy or Health Dept. Verbalized acceptance and understanding.  Screening Tests Health Maintenance  Topic Date Due   Colonoscopy  Never done   COVID-19 Vaccine (3 -  2024-25 season) 05/22/2023 (Originally 12/14/2022)   INFLUENZA VACCINE  07/13/2023 (Originally 11/13/2022)   Zoster Vaccines- Shingrix (1 of 2) 08/04/2023  (Originally 03/25/2002)   DTaP/Tdap/Td (1 - Tdap) 05/05/2024 (Originally 03/26/1971)   Pneumonia Vaccine 41+ Years old (1 of 1 - PCV) 05/05/2024 (Originally 03/25/2017)   Medicare Annual Wellness (AWV)  05/05/2024   Hepatitis C Screening  Completed   HPV VACCINES  Aged Out    Health Maintenance  Health Maintenance Due  Topic Date Due   Colonoscopy  Never done    Colorectal cancer screening: Type of screening: Colonoscopy. Completed 2018. Repeat every 10 years  Lung Cancer Screening: (Low Dose CT Chest recommended if Age 30-80 years, 20 pack-year currently smoking OR have quit w/in 15years.) does not qualify.   Lung Cancer Screening Referral: no  Additional Screening:  Hepatitis C Screening: does qualify; Completed 06/25/2021  Vision Screening: Recommended annual ophthalmology exams for early detection of glaucoma and other disorders of the eye. Is the patient up to date with their annual eye exam?  Yes  Who is the provider or what is the name of the office in which the patient attends annual eye exams? [facility] If pt is not established with a provider, would they like to be referred to a provider to establish care? No .   Dental Screening: Recommended annual dental exams for proper oral hygiene  Diabetic Foot Exam: n/a  Community Resource Referral / Chronic Care Management: CRR required this visit?  No   CCM required this visit?  No     Plan:     I have personally reviewed and noted the following in the patient's chart:   Medical and social history Use of alcohol, tobacco or illicit drugs  Current medications and supplements including opioid prescriptions. Patient is not currently taking opioid prescriptions. Functional ability and status Nutritional status Physical activity Advanced directives List of other physicians Hospitalizations, surgeries, and ER visits in previous 12 months Vitals Screenings to include cognitive, depression, and falls Referrals  and appointments  In addition, I have reviewed and discussed with patient certain preventive protocols, quality metrics, and best practice recommendations. A written personalized care plan for preventive services as well as general preventive health recommendations were provided to patient.     Barb Merino, LPN   0/98/1191   After Visit Summary: (In Person-Printed) AVS printed and given to the patient  Nurse Notes: none

## 2023-05-14 DIAGNOSIS — R202 Paresthesia of skin: Secondary | ICD-10-CM | POA: Insufficient documentation

## 2023-05-14 DIAGNOSIS — R030 Elevated blood-pressure reading, without diagnosis of hypertension: Secondary | ICD-10-CM | POA: Insufficient documentation

## 2023-05-14 NOTE — Assessment & Plan Note (Signed)
Blood pressure has been slightly elevated, repeat is normal. He will start magnesium supplement. Discussed importance of limiting intake of high salt foods

## 2023-05-14 NOTE — Assessment & Plan Note (Signed)
Will check vitamin D level and supplement as needed.    Also encouraged to spend 15 minutes in the sun daily.

## 2023-05-14 NOTE — Assessment & Plan Note (Signed)
Will check for metabolic cause.

## 2023-06-22 ENCOUNTER — Other Ambulatory Visit: Payer: Self-pay | Admitting: Nurse Practitioner

## 2023-06-22 DIAGNOSIS — N401 Enlarged prostate with lower urinary tract symptoms: Secondary | ICD-10-CM

## 2023-06-22 NOTE — Telephone Encounter (Signed)
 Copied from CRM [id number]. Topic: Clinical - Medication Refill >> Jun 22, 2023  9:31 AM Elle L wrote: Most Recent Primary Care Visit:  Provider: Arnette Felts  Department: [facility]  Visit Type: OFFICE VISIT  Date: 05/06/2023  Medication: tamsulosin (FLOMAX) 0.4 MG CAPS capsule  Has the patient contacted their pharmacy? Yes  Is this the correct pharmacy for this prescription? Yes If no, delete pharmacy and type the correct one.  This is the patient's preferred pharmacy:  [id number] - [place], [place] - [address] AT [facility] OF [address] & [address] [place] [address] Phone: [phone] Fax: [phone]   Has the prescription been filled recently? No  Is the patient out of the medication? Yes, 1 left.   Has the patient been seen for an appointment in the last year OR does the patient have an upcoming appointment? Yes  Can we respond through MyChart? No  Agent: Please be advised that Rx refills may take up to 3 business days. We ask that you follow-up with your pharmacy.

## 2023-07-03 MED ORDER — TAMSULOSIN HCL 0.4 MG PO CAPS: 0.4000 mg | ORAL_CAPSULE | Freq: Every day | ORAL | 0 refills | Status: DC

## 2023-11-04 NOTE — Progress Notes (Signed)
 LILLETTE Kristeen JINNY Gladis, CMA,acting as a Neurosurgeon for [name], FNP.,have documented all relevant documentation on the behalf of [name], FNP,as directed by  [name], FNP while in the presence of [name], FNP.  Subjective:   Patient ID: Aaron Mejia , male    DOB: 05/07/1950 , 73 y.o.   MRN: [id number]  Chief Complaint  Patient presents with   Annual Exam    Patient presents today for HM, Patient reports compliance with medication. Patient denies any chest pain, SOB, or headaches. Patient has no concerns today.     HPI Discussed the use of AI scribe software for clinical note transcription with the patient, who gave verbal consent to proceed.  History of Present Illness Aaron Mejia is a 73 year old male who presents for an annual physical exam.  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 He experiences aching in his legs when walking, which has progressively worsened, necessitating rest after short distances. He was previously on a medication, possibly 18 mg, which he discontinued in February due to leg aches.  He has a history of a blood clot and was on Eliquis  in 2018. He noticed a significant change in his walking ability following a COVID-19 vaccination, which caused dizziness and required him to retrain himself to walk. He experiences cramps in both legs. He drinks water regularly and sometimes takes magnesium  for cramps, but he has run out of his medications since February.  He follows a diet of two meals a day, one in the morning and one at night. No numbness or tingling in his feet or ankles, but he reports arthritis-related symptoms in his arm, affecting his ability to drive comfortably.  He reports ongoing issues with urination and is out of tamsulosin . No problems with bowel movements. He has a mole on his leg that has increased in size over time, and he mentions a family history of similar issues, as  his mother had a mole removed.   HPI   Past Medical History:  Diagnosis Date   Anemia    Arthritis    GERD (gastroesophageal reflux disease)      Family History  Problem Relation Age of Onset   Hypertension Mother    Hypertension Father    Kidney disease Father      Current Outpatient Medications:    Magnesium  Glycinate 120 MG CAPS, Take 2 capsules by mouth daily., Disp: 180 capsule, Rfl: 2   tamsulosin  (FLOMAX ) 0.4 MG CAPS capsule, Take 1 capsule (0.4 mg total) by mouth daily., Disp: 90 capsule, Rfl: 0   Vitamin D , Ergocalciferol , (DRISDOL ) 1.25 MG (50000 UNIT) CAPS capsule, Take 1 capsule (50,000 Units total) by mouth every 7 (seven) days., Disp: 12 capsule, Rfl: 0   Allergies  Allergen Reactions   Penicillins Swelling    Has never taken it was just told it makes him swell  Has patient had a PCN reaction causing immediate rash, facial/tongue/throat swelling, SOB or lightheadedness with hypotension: unknown Has patient had a PCN reaction causing severe rash involving mucus membranes or skin necrosis: unknown Has patient had a PCN reaction that required hospitalization unknown Has patient had a PCN reaction occurring within the last 10 years: No If all of the above answers are NO, then may proceed with Cephalosporin     Men's preventive visit. Patient Health Questionnaire (PHQ-2) is  Flowsheet Row Clinical Support from 05/06/2023 in [facility]  PHQ-2 Total Score 0  Patient is on a Regular diet - he eats two times a day. Exercising - 6 days a week he is walking from 7a-9a. Marital status: Single. Relevant history for alcohol use is:  Social History   Substance and Sexual Activity  Alcohol Use Yes   Alcohol/week: 6.0 standard drinks of alcohol   Types: 6 Cans of beer per week  Relevant history for tobacco use is:  Social History   Tobacco Use  Smoking Status Former  Smokeless Tobacco Never  Tobacco Comments   maybe 1 cigar per  day    Review of Systems  Constitutional: Negative.   HENT: Negative.    Eyes: Negative.   Respiratory: Negative.  Negative for apnea and cough.   Cardiovascular: Negative.   Gastrointestinal: Negative.   Genitourinary: Negative.   Musculoskeletal: Negative.        Bilateral lower leg pain worse to right leg mostly when walking.   Skin: Negative.   Allergic/Immunologic: Negative.   Neurological:  Negative for dizziness.  Hematological: Negative.   Psychiatric/Behavioral: Negative.       Today's Vitals   11/05/23 1041  BP: 120/78  Pulse: 72  Temp: 98.1 F (36.7 C)  TempSrc: Oral  Weight: 136 lb (61.7 kg)  Height: 5' 7 (1.702 m)  PainSc: 8   PainLoc: Leg   Body mass index is 21.3 kg/m.  Wt Readings from Last 3 Encounters:  11/05/23 136 lb (61.7 kg)  05/06/23 141 lb (64 kg)  05/06/23 141 lb 9.6 oz (64.2 kg)    Objective:  Physical Exam Vitals and nursing note reviewed.  Constitutional:      General: He is not in acute distress.    Appearance: Normal appearance.  HENT:     Mouth/Throat:     Dentition: Has dentures (upper).     Comments: He has gold caps to lower teeth.  Cardiovascular:     Rate and Rhythm: Normal rate and regular rhythm.     Pulses: Normal pulses.     Heart sounds: Normal heart sounds. No murmur heard. Pulmonary:     Effort: Pulmonary effort is normal. No respiratory distress.     Breath sounds: Normal breath sounds. No wheezing.  Skin:    General: Skin is warm.     Capillary Refill: Capillary refill takes less than 2 seconds.  Neurological:     General: No focal deficit present.     Mental Status: He is alert and oriented to person, place, and time.     Cranial Nerves: No cranial nerve deficit.     Motor: No weakness.  Psychiatric:        Mood and Affect: Mood normal.        Behavior: Behavior normal.        Thought Content: Thought content normal.        Judgment: Judgment normal.         Assessment And Plan:    Encounter for  annual health examination Assessment & Plan: Maintains walking routine, experienced weight loss. Quit smoking. Discussed statin therapy based on cholesterol levels. - Encourage walking routine. - Advise on hydration and protein intake. - Monitor cholesterol for statin therapy.  Orders: -     CBC with Differential/Platelet -     CMP14+EGFR -     Hemoglobin A1c  Vitamin D  deficiency Assessment & Plan: Will check vitamin D  level and supplement as needed.    Also encouraged to spend 15 minutes in the sun  daily.    Orders: -     Vitamin D  (Ergocalciferol ); Take 1 capsule (50,000 Units total) by mouth every 7 (seven) days.  Dispense: 12 capsule; Refill: 0  Enlarged prostate  Benign prostatic hyperplasia with urinary frequency -     Tamsulosin  HCl; Take 1 capsule (0.4 mg total) by mouth daily.  Dispense: 90 capsule; Refill: 0  Herpes zoster vaccination declined Assessment & Plan: Declines shingrix, educated on disease process and is aware if he changes his mind to notify office    COVID-19 vaccination declined Assessment & Plan: Declines covid 19 vaccine. Discussed risk of covid 38 and if he changes her mind about the vaccine to call the office. Education has been provided regarding the importance of this vaccine but patient still declined. Advised may receive this vaccine at local pharmacy or Health Dept.or vaccine clinic. Aware to provide a copy of the vaccination record if obtained from local pharmacy or Health Dept.  Encouraged to take multivitamin, vitamin d , vitamin c and zinc to increase immune system. Aware can call office if would like to have vaccine here at office. Verbalized acceptance and understanding.    Peripheral arterial disease (HCC) Assessment & Plan: Decreased right leg circulation likely due to PAD, with worsening claudication suggesting disease progression. Emphasized re-evaluation to prevent severe complications. - Order follow-up ABI for right leg  circulation. - Refer to vascular specialist if ABI worsens. - Discuss angioplasty if circulation compromised.  Orders: -     Lipid panel -     VAS US  ABI WITH/WO TBI; Future  History of vitamin D  deficiency -     VITAMIN D  25 Hydroxy (Vit-D Deficiency, Fractures)  Nevus Assessment & Plan: Mole increased in size. Recommended dermatological evaluation to rule out malignancy. - Refer to dermatologist for mole evaluation.  Orders: -     Ambulatory referral to Dermatology  Other orders -     Magnesium  Glycinate; Take 2 capsules by mouth daily.  Dispense: 180 capsule; Refill: 2     Return for 1 year physical, 80m . Patient was given opportunity to ask questions. Patient verbalized understanding of the plan and was able to repeat key elements of the plan. All questions were answered to their satisfaction.   [name], FNP  I, [name], FNP, have reviewed all documentation for this visit. The documentation on 11/05/23 for the exam, diagnosis, procedures, and orders are all accurate and complete.

## 2023-11-05 ENCOUNTER — Encounter: Payer: Self-pay | Admitting: Nurse Practitioner

## 2023-11-05 ENCOUNTER — Ambulatory Visit: Payer: Medicare Other | Admitting: Nurse Practitioner

## 2023-11-05 VITALS — BP 120/78 | HR 72 | Temp 98.1°F | Ht 67.0 in | Wt 136.0 lb

## 2023-11-05 DIAGNOSIS — I739 Peripheral vascular disease, unspecified: Secondary | ICD-10-CM | POA: Diagnosis not present

## 2023-11-05 DIAGNOSIS — N400 Enlarged prostate without lower urinary tract symptoms: Secondary | ICD-10-CM

## 2023-11-05 DIAGNOSIS — N401 Enlarged prostate with lower urinary tract symptoms: Secondary | ICD-10-CM | POA: Diagnosis not present

## 2023-11-05 DIAGNOSIS — E559 Vitamin D deficiency, unspecified: Secondary | ICD-10-CM | POA: Diagnosis not present

## 2023-11-05 DIAGNOSIS — Z8639 Personal history of other endocrine, nutritional and metabolic disease: Secondary | ICD-10-CM | POA: Diagnosis not present

## 2023-11-05 DIAGNOSIS — Z0000 Encounter for general adult medical examination without abnormal findings: Secondary | ICD-10-CM | POA: Diagnosis not present

## 2023-11-05 DIAGNOSIS — Z2821 Immunization not carried out because of patient refusal: Secondary | ICD-10-CM | POA: Diagnosis not present

## 2023-11-05 DIAGNOSIS — R350 Frequency of micturition: Secondary | ICD-10-CM | POA: Diagnosis not present

## 2023-11-05 DIAGNOSIS — D229 Melanocytic nevi, unspecified: Secondary | ICD-10-CM

## 2023-11-05 MED ORDER — VITAMIN D (ERGOCALCIFEROL) 1.25 MG (50000 UNIT) PO CAPS
50000.0000 [IU] | ORAL_CAPSULE | ORAL | 0 refills | Status: AC
Start: 2023-11-05 — End: ?

## 2023-11-05 MED ORDER — MAGNESIUM GLYCINATE 120 MG PO CAPS: 2.0000 | ORAL_CAPSULE | Freq: Every day | ORAL | 2 refills | Status: DC

## 2023-11-05 MED ORDER — TAMSULOSIN HCL 0.4 MG PO CAPS: 0.4000 mg | ORAL_CAPSULE | Freq: Every day | ORAL | 0 refills | Status: DC

## 2023-11-11 ENCOUNTER — Ambulatory Visit (HOSPITAL_COMMUNITY)
Admission: RE | Admit: 2023-11-11 | Discharge: 2023-11-11 | Disposition: A | Discharge status: Home / Self Care | Source: Ambulatory Visit | Attending: Nurse Practitioner | Admitting: Nurse Practitioner

## 2023-11-11 DIAGNOSIS — I739 Peripheral vascular disease, unspecified: Secondary | ICD-10-CM | POA: Insufficient documentation

## 2023-11-11 LAB — VAS US ABI WITH/WO TBI
Left ABI: 1.03
Right ABI: 0.81

## 2023-11-22 ENCOUNTER — Ambulatory Visit: Payer: Self-pay | Admitting: Nurse Practitioner

## 2023-11-22 DIAGNOSIS — Z2821 Immunization not carried out because of patient refusal: Secondary | ICD-10-CM | POA: Insufficient documentation

## 2023-11-22 DIAGNOSIS — Z0000 Encounter for general adult medical examination without abnormal findings: Secondary | ICD-10-CM | POA: Insufficient documentation

## 2023-11-22 DIAGNOSIS — D229 Melanocytic nevi, unspecified: Secondary | ICD-10-CM | POA: Insufficient documentation

## 2023-11-22 NOTE — Assessment & Plan Note (Signed)
 Will check vitamin D level and supplement as needed.    Also encouraged to spend 15 minutes in the sun daily.

## 2023-11-22 NOTE — Assessment & Plan Note (Signed)
 Declines shingrix, educated on disease process and is aware if he changes his mind to notify office

## 2023-11-22 NOTE — Assessment & Plan Note (Signed)
 Mole increased in size. Recommended dermatological evaluation to rule out malignancy. - Refer to dermatologist for mole evaluation.

## 2023-11-22 NOTE — Assessment & Plan Note (Signed)

## 2023-11-22 NOTE — Assessment & Plan Note (Signed)
 Maintains walking routine, experienced weight loss. Quit smoking. Discussed statin therapy based on cholesterol levels. - Encourage walking routine. - Advise on hydration and protein intake. - Monitor cholesterol for statin therapy.

## 2023-11-22 NOTE — Assessment & Plan Note (Signed)
 Decreased right leg circulation likely due to PAD, with worsening claudication suggesting disease progression. Emphasized re-evaluation to prevent severe complications. - Order follow-up ABI for right leg circulation. - Refer to vascular specialist if ABI worsens. - Discuss angioplasty if circulation compromised.

## 2024-02-08 ENCOUNTER — Other Ambulatory Visit: Payer: Self-pay | Admitting: Nurse Practitioner

## 2024-02-08 DIAGNOSIS — N401 Enlarged prostate with lower urinary tract symptoms: Secondary | ICD-10-CM

## 2024-02-15 ENCOUNTER — Other Ambulatory Visit: Payer: Self-pay | Admitting: Nurse Practitioner

## 2024-02-15 DIAGNOSIS — I739 Peripheral vascular disease, unspecified: Secondary | ICD-10-CM

## 2024-03-28 NOTE — Progress Notes (Unsigned)
 Office Note   History of Present Illness   Aaron Mejia is a 73 y.o. (January 27, 1951) male who presents for surveillance of PAD. They have a history of ***  The patient returns today for follow up. He/she denies any recent medical history changes. The patient also denies any claudication, rest pain, or tissue loss of the lower extremities.  Current Outpatient Medications  Medication Sig Dispense Refill   Magnesium  Glycinate 120 MG CAPS Take 2 capsules by mouth daily. 180 capsule 2   tamsulosin  (FLOMAX ) 0.4 MG CAPS capsule TAKE 1 CAPSULE(0.4 MG) BY MOUTH DAILY 90 capsule 0   Vitamin D , Ergocalciferol , (DRISDOL ) 1.25 MG (50000 UNIT) CAPS capsule Take 1 capsule (50,000 Units total) by mouth every 7 (seven) days. 12 capsule 0   No current facility-administered medications for this visit.    ***REVIEW OF SYSTEMS (negative unless checked):   Cardiac:  []  Chest pain or chest pressure? []  Shortness of breath upon activity? []  Shortness of breath when lying flat? []  Irregular heart rhythm?  Vascular:  []  Pain in calf, thigh, or hip brought on by walking? []  Pain in feet at night that wakes you up from your sleep? []  Blood clot in your veins? []  Leg swelling?  Pulmonary:  []  Oxygen at home? []  Productive cough? []  Wheezing?  Neurologic:  []  Sudden weakness in arms or legs? []  Sudden numbness in arms or legs? []  Sudden onset of difficult speaking or slurred speech? []  Temporary loss of vision in one eye? []  Problems with dizziness?  Gastrointestinal:  []  Blood in stool? []  Vomited blood?  Genitourinary:  []  Burning when urinating? []  Blood in urine?  Psychiatric:  []  Major depression  Hematologic:  []  Bleeding problems? []  Problems with blood clotting?  Dermatologic:  []  Rashes or ulcers?  Constitutional:  []  Fever or chills?  Ear/Nose/Throat:  []  Change in hearing? []  Nose bleeds? []  Sore throat?  Musculoskeletal:  []  Back pain? []  Joint pain? []   Muscle pain?   Physical Examination  ***There were no vitals filed for this visit. ***There is no height or weight on file to calculate BMI.  General:  WDWN in NAD; vital signs documented above Gait: Not observed HENT: WNL, normocephalic Pulmonary: normal non-labored breathing , without rales, rhonchi,  wheezing Cardiac: {Desc; regular/irreg:14544} HR, without murmurs {With/Without:20273} carotid bruit*** Abdomen: soft, NT, no masses Skin: {With/Without:20273} rashes Vascular Exam/Pulses: Palpable/nonpalpable femoral pulses, palpable/nonpalpable popliteal pulses, palpable/nonpalpable pedal pulses. Left DP/PT/Peroneal doppler signals. Right DP/PT/Peroneal doppler signals Extremities: {With/Without:20273} ischemic changes, {With/Without:20273} gangrene , {With/Without:20273} cellulitis; {With/Without:20273} open wounds;  Musculoskeletal: no muscle wasting or atrophy  Neurologic: A&O X 3;  No focal weakness or paresthesias are detected Psychiatric:  The pt has {Desc; normal/abnormal:11317::Normal} affect.  Non-Invasive Vascular imaging   ABI (***) R:  ABI: *** (***),  PT: {Signals:19197::none,mono,bi,tri} DP: {Signals:19197::none,mono,bi,tri} TBI:  *** L:  ABI: *** (***),  PT: {Signals:19197::none,mono,bi,tri} DP: {Signals:19197::none,mono,bi,tri} TBI: ***  Aortoiliac Duplex (***)   *** Arterial Duplex (***)   Medical Decision Making   Aaron Mejia is a 73 y.o. male who presents for surveillance of PAD  Based on the patient's vascular studies, their ABIs are essentially unchanged since last visit. *** Arterial duplex *** The patient denies any claudication, rest pain, or tissue loss. They have palpable/nonpalpable pedal pulses with *** doppler signals They will continue their *** and follow up with our office in *** months/year with ABIs and ***   Hutchinson Isenberg PA-C [facility] [place] Office:  [phone]  Clinic MD: ***

## 2024-03-29 ENCOUNTER — Ambulatory Visit

## 2024-04-01 ENCOUNTER — Other Ambulatory Visit: Payer: Self-pay | Admitting: *Deleted

## 2024-04-01 DIAGNOSIS — I70219 Atherosclerosis of native arteries of extremities with intermittent claudication, unspecified extremity: Secondary | ICD-10-CM

## 2024-04-04 NOTE — Progress Notes (Unsigned)
 "   Patient name: Aaron Mejia MRN: [id number] DOB: [birth year] Sex: male  REASON FOR CONSULT: Follow-up for PAD with claudication  HPI: Aaron Mejia is a 73 y.o. male, with no significant risk factors that presents for follow-up of PAD with right calf claudication.  Previously seen on 11/06/2022 by Dr. Eliza.  Ultimately no intervention was performed given he had no lifestyle limiting symptoms.  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   Procedure: TOTAL HIP ARTHROPLASTY;  Surgeon: Shari Sieving, MD;  Location: [facility] OR;  Service: Orthopedics;  Laterality: Left;    Family History  Problem Relation Age of Onset   Hypertension Mother    Hypertension Father    Kidney disease Father     SOCIAL HISTORY: Social History   Socioeconomic History   Marital status: Single    Spouse name: Not on file   Number of children: Not on file   Years of education: Not on file   Highest education level: Not on file  Occupational History   Not on file  Tobacco Use   Smoking status: Former   Smokeless tobacco: Never   Tobacco comments:    maybe 1 cigar per day  Vaping Use   Vaping status: Never Used  Substance and Sexual Activity   Alcohol use: Yes    Alcohol/week: 6.0 standard drinks of alcohol    Types: 6 Cans of beer per week   Drug use: No   Sexual activity: Yes  Other Topics Concern   Not on file  Social History Narrative   Not on file   Social Drivers of Health   Tobacco Use: Medium Risk (11/05/2023)   Patient History    Smoking Tobacco Use: Former    Smokeless Tobacco Use: Never    Passive Exposure: Not on file  Financial Resource Strain: Low Risk (05/06/2023)   Overall Financial Resource Strain (CARDIA)    Difficulty of Paying Living Expenses: Not hard at all  Food Insecurity: No Food Insecurity (05/06/2023)    Hunger Vital Sign    Worried About Running Out of Food in the Last Year: Never true    Ran Out of Food in the Last Year: Never true  Transportation Needs: No Transportation Needs (05/06/2023)   PRAPARE - Administrator, Civil Service (Medical): No    Lack of Transportation (Non-Medical): No  Physical Activity: Inactive (05/06/2023)   Exercise Vital Sign    Days of Exercise per Week: 0 days    Minutes of Exercise per Session: 0 min  Stress: No Stress Concern Present (05/06/2023)   [employer] of Occupational Health - Occupational Stress Questionnaire    Feeling of Stress : Not at all  Social Connections: Socially Integrated (05/06/2023)   Social Connection and Isolation Panel    Frequency of Communication with Friends and Family: Once a week    Frequency of Social Gatherings with Friends and Family: More than three times a week    Attends Religious Services: More than 4 times per year    Active Member of [employer] or Organizations: Yes    Attends Banker Meetings: More than 4 times per year    Marital Status: Married  Catering Manager Violence: Not At Risk (05/06/2023)   Humiliation, Afraid, Rape, and Kick questionnaire    Fear  of Current or Ex-Partner: No    Emotionally Abused: No    Physically Abused: No    Sexually Abused: No  Depression (PHQ2-9): Low Risk (05/06/2023)   Depression (PHQ2-9)    PHQ-2 Score: 0  Alcohol Screen: Low Risk (05/06/2023)   Alcohol Screen    Last Alcohol Screening Score (AUDIT): 0  Housing: Unknown (05/06/2023)   Housing Stability Vital Sign    Unable to Pay for Housing in the Last Year: No    Number of Times Moved in the Last Year: Not on file    Homeless in the Last Year: No  Utilities: Not At Risk (05/06/2023)   A[employer] Utilities    Threatened with loss of utilities: No  Health Literacy: Adequate Health Literacy (05/06/2023)   B1300 Health Literacy    Frequency of need for help with medical instructions: Never     Allergies[1]  Current Outpatient Medications  Medication Sig Dispense Refill   Magnesium  Glycinate 120 MG CAPS Take 2 capsules by mouth daily. 180 capsule 2   tamsulosin  (FLOMAX ) 0.4 MG CAPS capsule TAKE 1 CAPSULE(0.4 MG) BY MOUTH DAILY 90 capsule 0   Vitamin D , Ergocalciferol , (DRISDOL ) 1.25 MG (50000 UNIT) CAPS capsule Take 1 capsule (50,000 Units total) by mouth every 7 (seven) days. 12 capsule 0   No current facility-administered medications for this visit.    REVIEW OF SYSTEMS:  [X]  denotes positive finding, [ ]  denotes negative finding Cardiac  Comments:  Chest pain or chest pressure: ***   Shortness of breath upon exertion:    Short of breath when lying flat:    Irregular heart rhythm:        Vascular    Pain in calf, thigh, or hip brought on by ambulation:    Pain in feet at night that wakes you up from your sleep:     Blood clot in your veins:    Leg swelling:         Pulmonary    Oxygen at home:    Productive cough:     Wheezing:         Neurologic    Sudden weakness in arms or legs:     Sudden numbness in arms or legs:     Sudden onset of difficulty speaking or slurred speech:    Temporary loss of vision in one eye:     Problems with dizziness:         Gastrointestinal    Blood in stool:     Vomited blood:         Genitourinary    Burning when urinating:     Blood in urine:        Psychiatric    Major depression:         Hematologic    Bleeding problems:    Problems with blood clotting too easily:        Skin    Rashes or ulcers:        Constitutional    Fever or chills:      PHYSICAL EXAM: There were no vitals filed for this visit.  GENERAL: The patient is a well-nourished male, in no acute distress. The vital signs are documented above. CARDIAC: There is a regular rate and rhythm.  VASCULAR: *** PULMONARY: There is good air exchange bilaterally without wheezing or rales. ABDOMEN: Soft and non-tender with normal pitched bowel sounds.   MUSCULOSKELETAL: There are no major deformities or cyanosis. NEUROLOGIC: No focal weakness or paresthesias are detected.  SKIN: There are no ulcers or rashes noted. PSYCHIATRIC: The patient has a normal affect.  DATA:   ABIs 11/11/2023 are 0.81 on the right multiphasic and 1.03 on the left multiphasic  Assessment/Plan:  73 y.o. male, with no significant risk factors that presents for follow-up of PAD with right calf claudication.  Previously seen on 11/06/2022 by Dr. Eliza.  Ultimately no intervention was performed given he had no lifestyle limiting symptoms.  Again discussed for nonlifestyle limiting claudication recommend ongoing medical therapy.  Again recommended structured walking program as previously discussed with Dr. Eliza.  He can follow-up in 1 year in the [facility].  I have recommended aspirin statin   Lonni DOROTHA Gaskins, MD [facility] [place] Office: [phone]        [1]  Allergies Allergen Reactions   Penicillins Swelling    Has never taken it was just told it makes him swell  Has patient had a PCN reaction causing immediate rash, facial/tongue/throat swelling, SOB or lightheadedness with hypotension: unknown Has patient had a PCN reaction causing severe rash involving mucus membranes or skin necrosis: unknown Has patient had a PCN reaction that required hospitalization unknown Has patient had a PCN reaction occurring within the last 10 years: No If all of the above answers are NO, then may proceed with Cephalosporin   "

## 2024-04-05 ENCOUNTER — Encounter: Payer: Self-pay | Admitting: Vascular Surgery

## 2024-04-05 ENCOUNTER — Ambulatory Visit: Admitting: Vascular Surgery

## 2024-04-05 ENCOUNTER — Ambulatory Visit
Admission: RE | Admit: 2024-04-05 | Discharge: 2024-04-05 | Disposition: A | Discharge status: Home / Self Care | Source: Ambulatory Visit | Attending: Vascular Surgery | Admitting: Vascular Surgery

## 2024-04-05 VITALS — BP 154/83 | HR 58 | Temp 98.2°F | Resp 18 | Ht 67.0 in | Wt 140.3 lb

## 2024-04-05 DIAGNOSIS — I70211 Atherosclerosis of native arteries of extremities with intermittent claudication, right leg: Secondary | ICD-10-CM | POA: Insufficient documentation

## 2024-04-05 DIAGNOSIS — I739 Peripheral vascular disease, unspecified: Secondary | ICD-10-CM

## 2024-04-05 DIAGNOSIS — I70219 Atherosclerosis of native arteries of extremities with intermittent claudication, unspecified extremity: Secondary | ICD-10-CM | POA: Insufficient documentation

## 2024-04-05 LAB — VAS US ABI WITH/WO TBI
Left ABI: 1.03
Right ABI: 0.74

## 2024-04-05 MED ORDER — ATORVASTATIN CALCIUM 40 MG PO TABS: 40.0000 mg | ORAL_TABLET | Freq: Every day | ORAL | 11 refills | Status: AC

## 2024-04-05 MED ORDER — CILOSTAZOL 100 MG PO TABS: 100.0000 mg | ORAL_TABLET | Freq: Two times a day (BID) | ORAL | 11 refills | Status: AC

## 2024-04-11 ENCOUNTER — Other Ambulatory Visit: Payer: Self-pay | Admitting: *Deleted

## 2024-04-11 DIAGNOSIS — I70211 Atherosclerosis of native arteries of extremities with intermittent claudication, right leg: Secondary | ICD-10-CM

## 2024-04-11 DIAGNOSIS — I739 Peripheral vascular disease, unspecified: Secondary | ICD-10-CM

## 2024-04-27 NOTE — Progress Notes (Signed)
 LILLETTE Kristeen JINNY Gladis, CMA,acting as a neurosurgeon for Aaron Ada, FNP.,have documented all relevant documentation on the behalf of Aaron Ada, FNP,as directed by  Aaron Ada, FNP while in the presence of Aaron Ada, FNP.  Subjective:  Patient ID: Aaron Mejia , male    DOB: 01-14-1951 , 74 y.o.   MRN: [id number]  Chief Complaint  Patient presents with   Hypertension    Patient presents today for a bp follow up, Patient reports compliance with medication. Patient denies any chest pain, SOB, or headaches. Patient reports when he goes to the bathroom he has a hard time going when he has a bowel movement.     HPI  Discussed the use of AI scribe software for clinical note transcription with the patient, who gave verbal consent to proceed.  History of Present Illness [name] is a 74 year old male who presents for a follow-up visit after seeing a vascular doctor for leg issues.  He saw a vascular doctor in December for leg issues, specifically redness. He was prescribed medication that has improved his walking distance and reduced leg dragging. He takes a baby aspirin, 81 mg, which he is almost out of, and is unsure if he should continue it as it is not listed on his current medication list. He is also on Platol and 40 mg of Lipitor.  He is currently out of tamsulosin , which he took for urine flow issues, and took his last dose yesterday.  He had a colonoscopy in 2018 at a [facility] in [facility] and is unsure if he needs another one soon. He recalls having paperwork at home regarding this procedure.  No chest pain or shortness of breath.  Past Medical History:  Diagnosis Date   Anemia    Arthritis    GERD (gastroesophageal reflux disease)      Family History  Problem Relation Age of Onset   Hypertension Mother    Hypertension Father    Kidney disease Father     Current Medications[1]   Allergies[2]   Review of Systems  Constitutional: Negative.   HENT: Negative.     Eyes: Negative.   Respiratory: Negative.  Negative for apnea and cough.   Cardiovascular: Negative.   Genitourinary: Negative.   Skin: Negative.   Allergic/Immunologic: Negative.   Neurological:  Negative for dizziness.  Hematological: Negative.   Psychiatric/Behavioral: Negative.       Today's Vitals   04/28/24 1051 04/28/24 1117  BP: (!) 150/80 130/74  Pulse: 78   Temp: 98.6 F (37 C)   TempSrc: Oral   Weight: 137 lb 6.4 oz (62.3 kg)   Height: 5' 7 (1.702 m)   PainSc: 0-No pain    Body mass index is 21.52 kg/m.  Wt Readings from Last 3 Encounters:  04/28/24 137 lb 6.4 oz (62.3 kg)  04/05/24 140 lb 4.8 oz (63.6 kg)  11/05/23 136 lb (61.7 kg)      Objective:  Physical Exam Vitals and nursing note reviewed.  Constitutional:      General: He is not in acute distress.    Appearance: Normal appearance.  Cardiovascular:     Rate and Rhythm: Normal rate and regular rhythm.     Pulses: Normal pulses.     Heart sounds: Normal heart sounds. No murmur heard. Pulmonary:     Effort: Pulmonary effort is normal. No respiratory distress.     Breath sounds: Normal breath sounds. No wheezing.  Skin:    General: Skin  is warm.     Capillary Refill: Capillary refill takes less than 2 seconds.  Neurological:     General: No focal deficit present.     Mental Status: He is alert and oriented to person, place, and time.     Cranial Nerves: No cranial nerve deficit.     Motor: No weakness.  Psychiatric:        Mood and Affect: Mood normal.        Behavior: Behavior normal.        Thought Content: Thought content normal.        Judgment: Judgment normal.         Assessment And Plan:   Assessment & Plan Elevated systolic blood pressure reading without diagnosis of hypertension Blood pressure is good systolic is right at goal Peripheral arterial disease Improved symptoms with medication prescribed by vascular doctor. Increased walking distance, reduced leg drag. - Contact  vascular specialist to confirm aspirin therapy continuation and arrange prescription if needed. Benign prostatic hyperplasia with urinary frequency Ran out of tamsulosin . - Sent prescription for tamsulosin . Influenza vaccination declined  Herpes zoster vaccination declined   Orders Placed This Encounter  Procedures   BMP8+eGFR     Return for keep same next.  Patient was given opportunity to ask questions. Patient verbalized understanding of the plan and was able to repeat key elements of the plan. All questions were answered to their satisfaction.    LILLETTE Aaron Ada, FNP, have reviewed all documentation for this visit. The documentation on 04/28/24 for the exam, diagnosis, procedures, and orders are all accurate and complete.   IF YOU HAVE BEEN REFERRED TO A SPECIALIST, IT MAY TAKE 1-2 WEEKS TO SCHEDULE/PROCESS THE REFERRAL. IF YOU HAVE NOT HEARD FROM US /SPECIALIST IN TWO WEEKS, PLEASE GIVE US  A CALL AT [phone].      [1]  Current Outpatient Medications:    atorvastatin  (LIPITOR) 40 MG tablet, Take 1 tablet (40 mg total) by mouth daily., Disp: 30 tablet, Rfl: 11   cilostazol  (PLETAL ) 100 MG tablet, Take 1 tablet (100 mg total) by mouth 2 (two) times daily before a meal., Disp: 60 tablet, Rfl: 11   Vitamin D , Ergocalciferol , (DRISDOL ) 1.25 MG (50000 UNIT) CAPS capsule, Take 1 capsule (50,000 Units total) by mouth every 7 (seven) days., Disp: 12 capsule, Rfl: 0   Magnesium  Glycinate 120 MG CAPS, Take 2 capsules by mouth daily., Disp: 180 capsule, Rfl: 2   tamsulosin  (FLOMAX ) 0.4 MG CAPS capsule, Take 1 capsule (0.4 mg total) by mouth daily., Disp: 90 capsule, Rfl: 1 [2]  Allergies Allergen Reactions   Penicillins Swelling    Has never taken it was just told it makes him swell  Has patient had a PCN reaction causing immediate rash, facial/tongue/throat swelling, SOB or lightheadedness with hypotension: unknown Has patient had a PCN reaction causing severe rash involving  mucus membranes or skin necrosis: unknown Has patient had a PCN reaction that required hospitalization unknown Has patient had a PCN reaction occurring within the last 10 years: No If all of the above answers are NO, then may proceed with Cephalosporin

## 2024-04-28 ENCOUNTER — Ambulatory Visit: Payer: Self-pay | Admitting: Nurse Practitioner

## 2024-04-28 ENCOUNTER — Encounter: Payer: Self-pay | Admitting: Nurse Practitioner

## 2024-04-28 VITALS — BP 130/74 | HR 78 | Temp 98.6°F | Ht 67.0 in | Wt 137.4 lb

## 2024-04-28 DIAGNOSIS — R350 Frequency of micturition: Secondary | ICD-10-CM | POA: Diagnosis not present

## 2024-04-28 DIAGNOSIS — N401 Enlarged prostate with lower urinary tract symptoms: Secondary | ICD-10-CM | POA: Diagnosis not present

## 2024-04-28 DIAGNOSIS — Z2821 Immunization not carried out because of patient refusal: Secondary | ICD-10-CM

## 2024-04-28 DIAGNOSIS — R030 Elevated blood-pressure reading, without diagnosis of hypertension: Secondary | ICD-10-CM | POA: Diagnosis not present

## 2024-04-28 DIAGNOSIS — I739 Peripheral vascular disease, unspecified: Secondary | ICD-10-CM

## 2024-04-28 MED ORDER — TAMSULOSIN HCL 0.4 MG PO CAPS: 0.4000 mg | ORAL_CAPSULE | Freq: Every day | ORAL | 1 refills | Status: AC

## 2024-04-28 MED ORDER — MAGNESIUM GLYCINATE 120 MG PO CAPS: 2.0000 | ORAL_CAPSULE | Freq: Every day | ORAL | 2 refills | Status: AC

## 2024-04-28 NOTE — Assessment & Plan Note (Signed)
 Improved symptoms with medication prescribed by vascular doctor. Increased walking distance, reduced leg drag. - Contact vascular specialist to confirm aspirin therapy continuation and arrange prescription if needed.

## 2024-04-28 NOTE — Assessment & Plan Note (Signed)
 Ran out of tamsulosin . - Sent prescription for tamsulosin .

## 2024-04-28 NOTE — Assessment & Plan Note (Signed)
 Blood pressure is good systolic is right at goal

## 2024-05-25 ENCOUNTER — Ambulatory Visit: Payer: Self-pay

## 2024-05-25 ENCOUNTER — Ambulatory Visit: Payer: Self-pay | Admitting: Nurse Practitioner

## 2024-07-06 ENCOUNTER — Ambulatory Visit: Admitting: Physician Assistant

## 2024-10-04 ENCOUNTER — Ambulatory Visit

## 2024-10-04 ENCOUNTER — Ambulatory Visit (HOSPITAL_COMMUNITY)

## 2024-11-10 ENCOUNTER — Encounter: Payer: Self-pay | Admitting: Nurse Practitioner
